# Patient Record
Sex: Female | Born: 1965 | Race: White | Hispanic: No | Marital: Married | Smoking: Never smoker
Health system: Southern US, Community
[De-identification: ages and names within clinical notes are randomized; demographics above are authoritative.]

## PROBLEM LIST (undated history)

## (undated) DIAGNOSIS — R011 Cardiac murmur, unspecified: Secondary | ICD-10-CM

## (undated) DIAGNOSIS — B977 Papillomavirus as the cause of diseases classified elsewhere: Secondary | ICD-10-CM

## (undated) DIAGNOSIS — IMO0002 Reserved for concepts with insufficient information to code with codable children: Secondary | ICD-10-CM

## (undated) DIAGNOSIS — N870 Mild cervical dysplasia: Secondary | ICD-10-CM

## (undated) DIAGNOSIS — C4491 Basal cell carcinoma of skin, unspecified: Secondary | ICD-10-CM

## (undated) HISTORY — DX: Basal cell carcinoma of skin, unspecified: C44.91

## (undated) HISTORY — DX: Papillomavirus as the cause of diseases classified elsewhere: B97.7

## (undated) HISTORY — DX: Mild cervical dysplasia: N87.0

## (undated) HISTORY — DX: Cardiac murmur, unspecified: R01.1

## (undated) HISTORY — DX: Reserved for concepts with insufficient information to code with codable children: IMO0002

## (undated) HISTORY — PX: AUGMENTATION MAMMAPLASTY: SUR837

---

## 1989-11-11 HISTORY — PX: KNEE SURGERY: SHX244

## 1999-10-26 ENCOUNTER — Other Ambulatory Visit: Admission: RE | Admit: 1999-10-26 | Discharge: 1999-10-26 | Payer: Self-pay | Admitting: Obstetrics and Gynecology

## 1999-11-12 HISTORY — PX: MANDIBLE SURGERY: SHX707

## 2000-05-30 ENCOUNTER — Encounter: Admission: RE | Admit: 2000-05-30 | Discharge: 2000-05-30 | Payer: Self-pay | Admitting: Oral Surgery

## 2000-05-30 ENCOUNTER — Encounter: Payer: Self-pay | Admitting: Oral Surgery

## 2000-06-02 ENCOUNTER — Ambulatory Visit (HOSPITAL_BASED_OUTPATIENT_CLINIC_OR_DEPARTMENT_OTHER): Admission: RE | Admit: 2000-06-02 | Discharge: 2000-06-03 | Payer: Self-pay

## 2000-12-16 ENCOUNTER — Other Ambulatory Visit: Admission: RE | Admit: 2000-12-16 | Discharge: 2000-12-16 | Payer: Self-pay | Admitting: Gynecology

## 2001-10-03 ENCOUNTER — Inpatient Hospital Stay (HOSPITAL_COMMUNITY): Admission: AD | Admit: 2001-10-03 | Discharge: 2001-10-05 | Payer: Self-pay | Admitting: *Deleted

## 2001-11-09 ENCOUNTER — Other Ambulatory Visit: Admission: RE | Admit: 2001-11-09 | Discharge: 2001-11-09 | Payer: Self-pay | Admitting: Gynecology

## 2001-11-12 ENCOUNTER — Encounter: Payer: Self-pay | Admitting: *Deleted

## 2001-11-12 ENCOUNTER — Encounter: Admission: RE | Admit: 2001-11-12 | Discharge: 2001-11-12 | Payer: Self-pay | Admitting: Gynecology

## 2003-03-04 ENCOUNTER — Other Ambulatory Visit: Admission: RE | Admit: 2003-03-04 | Discharge: 2003-03-04 | Payer: Self-pay | Admitting: Gynecology

## 2003-04-14 ENCOUNTER — Ambulatory Visit (HOSPITAL_COMMUNITY): Admission: RE | Admit: 2003-04-14 | Discharge: 2003-04-14 | Payer: Self-pay | Admitting: Family Medicine

## 2003-04-14 ENCOUNTER — Encounter: Payer: Self-pay | Admitting: Family Medicine

## 2003-12-05 ENCOUNTER — Inpatient Hospital Stay (HOSPITAL_COMMUNITY): Admission: AD | Admit: 2003-12-05 | Discharge: 2003-12-08 | Payer: Self-pay | Admitting: Gynecology

## 2004-01-30 DIAGNOSIS — D229 Melanocytic nevi, unspecified: Secondary | ICD-10-CM

## 2004-01-30 DIAGNOSIS — C4499 Other specified malignant neoplasm of skin, unspecified: Secondary | ICD-10-CM

## 2004-01-30 HISTORY — DX: Other specified malignant neoplasm of skin, unspecified: C44.99

## 2004-01-30 HISTORY — DX: Melanocytic nevi, unspecified: D22.9

## 2004-02-01 ENCOUNTER — Other Ambulatory Visit: Admission: RE | Admit: 2004-02-01 | Discharge: 2004-02-01 | Payer: Self-pay | Admitting: Gynecology

## 2005-02-25 ENCOUNTER — Other Ambulatory Visit: Admission: RE | Admit: 2005-02-25 | Discharge: 2005-02-25 | Payer: Self-pay | Admitting: Gynecology

## 2005-09-05 ENCOUNTER — Other Ambulatory Visit: Admission: RE | Admit: 2005-09-05 | Discharge: 2005-09-05 | Payer: Self-pay | Admitting: Gynecology

## 2005-09-11 DIAGNOSIS — IMO0002 Reserved for concepts with insufficient information to code with codable children: Secondary | ICD-10-CM

## 2005-09-11 HISTORY — DX: Reserved for concepts with insufficient information to code with codable children: IMO0002

## 2006-06-10 ENCOUNTER — Ambulatory Visit (HOSPITAL_COMMUNITY): Admission: RE | Admit: 2006-06-10 | Discharge: 2006-06-10 | Payer: Self-pay | Admitting: Gynecology

## 2006-06-11 DIAGNOSIS — N870 Mild cervical dysplasia: Secondary | ICD-10-CM

## 2006-06-11 HISTORY — DX: Mild cervical dysplasia: N87.0

## 2006-06-12 ENCOUNTER — Other Ambulatory Visit: Admission: RE | Admit: 2006-06-12 | Discharge: 2006-06-12 | Payer: Self-pay | Admitting: Gynecology

## 2006-12-25 ENCOUNTER — Other Ambulatory Visit: Admission: RE | Admit: 2006-12-25 | Discharge: 2006-12-25 | Payer: Self-pay | Admitting: Gynecology

## 2007-06-18 ENCOUNTER — Other Ambulatory Visit: Admission: RE | Admit: 2007-06-18 | Discharge: 2007-06-18 | Payer: Self-pay | Admitting: Gynecology

## 2008-11-11 DIAGNOSIS — B977 Papillomavirus as the cause of diseases classified elsewhere: Secondary | ICD-10-CM

## 2008-11-11 HISTORY — DX: Papillomavirus as the cause of diseases classified elsewhere: B97.7

## 2009-05-10 ENCOUNTER — Ambulatory Visit: Payer: Self-pay | Admitting: Women's Health

## 2009-05-10 ENCOUNTER — Other Ambulatory Visit: Admission: RE | Admit: 2009-05-10 | Discharge: 2009-05-10 | Payer: Self-pay | Admitting: Gynecology

## 2009-05-10 ENCOUNTER — Encounter: Payer: Self-pay | Admitting: Women's Health

## 2009-07-04 ENCOUNTER — Ambulatory Visit: Payer: Self-pay | Admitting: Gynecology

## 2009-07-12 ENCOUNTER — Ambulatory Visit: Payer: Self-pay | Admitting: Gynecology

## 2010-07-06 ENCOUNTER — Other Ambulatory Visit: Admission: RE | Admit: 2010-07-06 | Discharge: 2010-07-06 | Payer: Self-pay | Admitting: Gynecology

## 2010-07-06 ENCOUNTER — Ambulatory Visit: Payer: Self-pay | Admitting: Women's Health

## 2010-10-23 ENCOUNTER — Encounter
Admission: RE | Admit: 2010-10-23 | Discharge: 2010-10-23 | Payer: Self-pay | Source: Home / Self Care | Attending: Chiropractic Medicine | Admitting: Chiropractic Medicine

## 2010-12-25 ENCOUNTER — Other Ambulatory Visit: Payer: Self-pay | Admitting: Endocrinology

## 2010-12-25 DIAGNOSIS — E049 Nontoxic goiter, unspecified: Secondary | ICD-10-CM

## 2011-03-29 NOTE — Discharge Summary (Signed)
[facility]  Patient:    BALEIGH, RENNAKER Visit Number: [id number] MRN: [id number]          Service Type: OBS Location: 910A [id number] [id number] Attending Physician:  Wetzel Bjornstad Dictated by:   Antony Contras, [facility] Admit Date:  10/03/2001 Discharge Date: 10/05/2001                             Discharge Summary  DISCHARGE DIAGNOSES:          1. Intrauterine pregnancy at term.                               2. Occiput posterior position.                               3. Chorioamnionitis.  PROCEDURES:                   Outlet forceps-assisted vaginal delivery, with delivery of viable infant.  HISTORY OF PRESENT ILLNESS:   The patient is a 45 year old primigravida with an EDC of October 06, 2001, by ultrasound.  Prenatal course was uncomplicated.  PRENATAL LABORATORY DATA:     Blood type O negative.  Husband is also O negative.  Antibody screen negative.  RPR, HBsAg, HIV nonreactive.  The patient declined MSAFP.  HOSPITAL COURSE:              The patient was admitted on October 03, 2001, with spontaneous onset of labor at 39 weeks.  Cervix rapidly changed from 1.5-4.0 cm.  She did progress to complete dilatation.  Did develop a fever of 102 with fetal tachycardia.  Delivery was accomplished with outlet forceps. She delivered an Apgars of 7 and 53 female infant weighing 7 pounds 6 ounces over an intact perineum with repair of a second-degree and focal laceration.  POSTPARTUM COURSE:            She remained afebrile, with no difficulty voiding.  Was able to be discharged on her second postpartum day in satisfactory condition.  CBC:  Hematocrit 31.4, hemoglobin 11.1, WBC 26.9, platelets 198.  FOLLOW-UP:                    In six weeks.  MEDICATIONS:                  Continue with prenatal vitamins and iron. Motrin or Tylox for pain. Dictated by:   Antony Contras, [facility] Attending Physician:  Wetzel Bjornstad DD:  11/13/01 TD:  11/14/01 Job:  [id number]

## 2011-03-29 NOTE — Op Note (Signed)
[place]  Patient:    Andrea Lamb, Andrea Lamb                      MRN: [id number] Proc. Date: 06/02/00 Attending:  Vania Rea. Warren Danes, D.D.S.                           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  SURGERY PERFORMED:  Jerry Caras I Maxillary osteotomy with mid palatal segmentalization, activation of custom appliance.  SURGEON:  Vania Rea. Warren Danes, D.D.S.  FIRST ASSISTANT:  Gaynell Face.  ANESTHESIA:  General via nasal endotracheal intubation.  ESTIMATED BLOOD LOSS:  Less than 100 cc.  FLUID REPLACEMENT:  Approximately 1200 cc Crystalloid solutions.  COMPLICATIONS:  None apparent.  INDICATIONS FOR PROCEDURE:  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  The patient has a chronic history of total inability to bite into normal type foods due to the inverse relationship of the maxillary and mandibular dentition and as a result, the patient has developed significant and chronic temporomandibular joint dysfunction, including anteriorly displaced temporomandibular menisci and associated pain.  All conservative measures have been attempted, but due to the severe transverse malformation of the maxilla, the only means of surgery likely to correct this was surgical intervention.  The surgery is being performed for a functional reason only.  DETAILS OF PROCEDURE:  On June 02, 2000, [name] was taken to [facility], where she was placed on the operating room table in a supine position.  Following successful nasal endotracheal intubation of general anesthesia, the  patients face, neck and oral cavity were prepped and draped in the usual sterile operating room fashion.  The hypopharynx was suctioned free of fluids and secretions, and a moistened 2 inch vaginal pack was placed as a throat pack.  Attention was then directed intraorally, where approximately 16 cc of 0.5% Xylocaine containing 1:200,000 epinephrine were infiltrated in the maxillary buccal vestibular tissues, the posterior, superior alveolar neurovascular regions, and the corresponding palatal soft tissues.  A full-thickness mucoperiosteal incision was made in the depth of the maxillary vestibule beginning over tooth #11 and brought horizontally through the depth of the vestibule above tooth #6.  The incision was scribed to aid in closure at the end of the procedure.  A #9 Molt periosteal elevator was then used to reflect a full-thickness mucoperiosteal flap superiorly approximately 1.5 cm exposing the anterior nasal spine and the right and left piriform rim.  Anterior nasal spine was reduced using rongeur and cutting forceps.  The nasal mucosa tissues were then reflected off of the nasal floor and the right and left lateral nasal walls using a straight Therapist, nutritional.  The periosteum was then reflected off of the right and left maxilla posteriorly from the piriform rim to the maxillary buttresses.  From this point posteriorly, a curved Freer elevator was used to reflect the periosteum off the lateral aspects of the maxilla.  The dissection was carried posteriorly to the pterygomaxillary junctions, which were identified using the curved Freer elevators.  Approximately 6 mm above the apices of the maxillary dentition, the proposed osteotomy site was marked using an Stryker rotary osteotome and a 107 fissure bur.  Obwegeser retractors were used to protect the lateral soft tissues and a straight Therapist, nutritional was used to protect the mucosal tissues.  With copious irrigation and the Stryker  rotary osteotome, a Jerry Caras I maxillary osteotomy was completed bilaterally from the piriform rim posteriorly to the maxillary buttress.  The osteotomy was then completed posteriorly from the maxillary buttress to the pterygomaxillary junction using the Stryker reciprocating saw with the small straight blade.  The right and left lateral nasal walls were then sectioned at this same level using a spatula osteotome with gentle tapping pressures from a fiberglass tipped mallet.  The pterygomaxillary junction was then separated bilaterally using a pterygomaxillary osteotome with the osteotome being directed medially, anteriorly and inferiorly.  The pterygoid plate was separated from the posterior aspect of the maxilla with the fiberglass tipped mallet and the osteotome with digital pressure being used to protect the palatal soft tissues.  ______ complete mobility of the maxilla was obtained.  A nasal septal osteotome was then used to release the nasal septum from the nasal floor with the osteotome being directed posteriorly and inferiorly.  Digital pressure was used to protect the posterior palatal soft tissues.  The mid palatal suture was then identified and the maxilla was then segmentalized in the following manner.  Starting with the spatula osteotome, the palatal suture was slightly separated.  A small straight osteotome was then placed into this area and with gentle tapping pressure from the fiberglass tipped mallet, the area was widened.  Digital pressure was used to protect the palatal soft tissues.  Complete mobility of the right and left maxilla was obtained using the fiberglass handled osteotome, which was twisted ensuring complete mobility of the segments.  A previously constructed custom appliance was then activated to secure the maxilla in its new position.  The mucoperiosteal margins were then approximated following thorough irrigation with sterile saline irrigating solutions  and suctioning.  The mucoperiosteal margins were sutured using 4-0 chromic suture material on a FS-2 needle.  The oral cavity was then thoroughly irrigated and suctioned.  The throat pack  was removed from the hypopharynx and the hypopharynx suctioned free of fluids and secretions.  Ms. Rama was allowed to awaken from the anesthesia and taken to the recovery room, where she tolerated the procedure well and without apparent complication. DD:  06/02/00 TD:  06/03/00 Job: [id number]

## 2011-03-29 NOTE — H&P (Signed)
NAME:  Andrea Lamb, Andrea Lamb                       ACCOUNT NO.:  [id number]   MEDICAL RECORD NO.:  [id number]                   PATIENT TYPE:  MAT   LOCATION:  MATC                                 [facility]ILITY:  [facility]   PHYSICIAN:  Juan H. Lily Peer, M.D.             DATE OF BIRTH:  09-03-1966   DATE OF ADMISSION:  DATE OF DISCHARGE:                                HISTORY & PHYSICAL   SCHEDULED DATE OF ADMISSION:  December 05, 2003   REASON FOR ADMISSION:  1. Term intrauterine pregnancy at 15 weeks estimated gestational age.  2. Oligohydramnios.   HISTORY:  The patient is a 45 year old gravida 2 para 1 at 28 weeks  estimated gestational age who is being induced this evening secondary to  oligohydramnios.  She had an ultrasound today at [facility] with  an AFI of 7.2.  The fetus is in the vertex presentation, heart rate 124.  The patient's prenatal course essentially had been unremarkable with the  exception of advanced maternal age.  She had declined genetic amniocentesis  and maternal serum alpha-fetoprotein.  Both she and her husband were Rh  negative so she did not receive RhoGAM.  The patient has had appropriate  weight up to 31 pounds this pregnancy has passed her diabetes screen.   PAST MEDICAL HISTORY:  She denies any allergies.  She has had a prior normal  spontaneous vaginal delivery in 2002 at [redacted] weeks gestation at 7 pounds 6  ounces.  See Hollister form.   REVIEW OF SYSTEMS:  See Hollister form.   PHYSICAL EXAMINATION:  VITAL SIGNS:  Her blood pressure was 108/66.  Urine  was trace for protein, negative for glucose.  Her weight was 173 pounds.  HEENT:  Unremarkable.  NECK:  Supple, trachea midline.  No carotid bruits, no thyromegaly.  LUNGS:  Clear to auscultation without rhonchi or wheezes.  HEART:  Regular rate and rhythm, no murmurs or gallops.  BREAST:  Exam was not done.  ABDOMEN:  Gravid uterus, vertex presentation by [employer].  Fundal  height 39  cm.  PELVIC:  Cervix was fingertip to 1 cm, 50%, ballotable.  EXTREMITIES:  DTRs 1+, negative clonus, trace edema.   PRENATAL LABORATORY DATA:  Blood type O negative, negative antibody screen.  VDRL was nonreactive.  Rubella immune.  Hepatitis B surface antigen and HIV  were negative.  Diabetes screen was normal.  Group B strep culture was  negative.  Alpha-fetoprotein, cystic fibrosis, and genetic amniocentesis  were all declined.   ASSESSMENT:  A 45 year old gravida 2 para 1 at [redacted] weeks gestation with  oligohydramnios, amniotic fetal index of 7.2 at 5% for [redacted] weeks gestation.  Cervix fingertip to 1 cm, 50%, -3 to ballotable.  The patient will be  admitted this evening for induction.  She did have an NST in the office this  afternoon with a reactive tracing noted.  She will  have the Cervidil overnight  and start in the morning with Pitocin for  delivery.  The risks, benefits, and pros and cons were discussed.  All  questions were answered and will follow accordingly.   PLAN:  As per assessment above.                                               Juan H. Lily Peer, M.D.    JHF/MEDQ  D:  12/05/2003  T:  12/05/2003  Job:  [id number]

## 2011-03-29 NOTE — Discharge Summary (Signed)
NAME:  Andrea Lamb, Andrea Lamb                       ACCOUNT NO.:  [id number]   MEDICAL RECORD NO.:  [id number]                   PATIENT TYPE:  INP   LOCATION:  9119                                 [facility]ACILITY:  [facility]   PHYSICIAN:  Timothy P. Fontaine, M.D.           DATE OF BIRTH:  11-Dec-1965   DATE OF ADMISSION:  12/05/2003  DATE OF DISCHARGE:  12/08/2003                                 DISCHARGE SUMMARY   DISCHARGE DIAGNOSES:  1. Intrauterine pregnancy at 40 weeks, delivered.  2. Oligohydramnios.  3. Status post normal spontaneous vaginal delivery.   HISTORY:  A 36-years-of-age female gravida 2 para 1 with an EDC of December 05, 2003.  Prenatal course had been complicated by advanced maternal age.  The patient declined amniocentesis.  The patient and husband are both Rh  negative.  She was found to have oligohydramnios with an AFI of 7.2.  Secondary to same the patient was admitted for induction.   HOSPITAL COURSE:  On December 05, 2003 the patient was admitted at 40 weeks.  Cervidil was placed and on the a.m. of December 06, 2003 Pitocin was begun.  Artificial rupture of membranes performed showed clear fluid.  The patient  on December 06, 2003 underwent a spontaneous vaginal delivery of a female,  Apgars of 8 and 9, weight of 7 pounds 5 ounces.  There was a third degree  vaginal/perineal tear.  There were no complications.  Postpartum the patient  remained afebrile, voiding, in stable condition, and she was discharged to  home in satisfactory condition on December 08, 2003.   ACCESSORY CLINICAL FINDINGS/LABORATORY DATA:  The patient is O negative  (infant Rh negative).  Rubella immune.  On December 07, 2003 hemoglobin was  11.9.   DISPOSITION:  1. The patient is discharged to home.  2. Return to the office in 6 weeks.  3. Given a prescription of Darvocet p.r.n. pain #15.     Susa Loffler, P.A.                    Timothy P. Audie Box, M.D.    Ardath Sax  D:  01/16/2004  T:   01/16/2004  Job:  [id number]

## 2011-06-17 ENCOUNTER — Ambulatory Visit
Admission: RE | Admit: 2011-06-17 | Discharge: 2011-06-17 | Disposition: A | Discharge status: Home / Self Care | Payer: BC Managed Care – PPO | Source: Ambulatory Visit | Attending: Endocrinology | Admitting: Endocrinology

## 2011-06-17 DIAGNOSIS — E049 Nontoxic goiter, unspecified: Secondary | ICD-10-CM

## 2011-07-08 ENCOUNTER — Other Ambulatory Visit: Payer: Self-pay | Admitting: Endocrinology

## 2011-07-08 DIAGNOSIS — E049 Nontoxic goiter, unspecified: Secondary | ICD-10-CM

## 2011-07-17 ENCOUNTER — Other Ambulatory Visit: Payer: Self-pay | Admitting: Endocrinology

## 2011-07-17 DIAGNOSIS — E041 Nontoxic single thyroid nodule: Secondary | ICD-10-CM

## 2011-07-18 DIAGNOSIS — B977 Papillomavirus as the cause of diseases classified elsewhere: Secondary | ICD-10-CM | POA: Insufficient documentation

## 2011-07-18 DIAGNOSIS — R011 Cardiac murmur, unspecified: Secondary | ICD-10-CM | POA: Insufficient documentation

## 2011-07-18 DIAGNOSIS — C4491 Basal cell carcinoma of skin, unspecified: Secondary | ICD-10-CM | POA: Insufficient documentation

## 2011-07-23 ENCOUNTER — Ambulatory Visit
Admission: RE | Admit: 2011-07-23 | Discharge: 2011-07-23 | Disposition: A | Discharge status: Home / Self Care | Payer: BC Managed Care – PPO | Source: Ambulatory Visit | Attending: Endocrinology | Admitting: Endocrinology

## 2011-07-23 ENCOUNTER — Other Ambulatory Visit (HOSPITAL_COMMUNITY)
Admission: RE | Admit: 2011-07-23 | Discharge: 2011-07-23 | Disposition: A | Discharge status: Home / Self Care | Payer: BC Managed Care – PPO | Source: Ambulatory Visit | Attending: Interventional Radiology | Admitting: Interventional Radiology

## 2011-07-23 DIAGNOSIS — E049 Nontoxic goiter, unspecified: Secondary | ICD-10-CM | POA: Insufficient documentation

## 2011-07-23 DIAGNOSIS — E041 Nontoxic single thyroid nodule: Secondary | ICD-10-CM

## 2011-07-26 ENCOUNTER — Encounter: Payer: BC Managed Care – PPO | Admitting: Women's Health

## 2011-08-13 ENCOUNTER — Ambulatory Visit (INDEPENDENT_AMBULATORY_CARE_PROVIDER_SITE_OTHER): Payer: BC Managed Care – PPO | Admitting: Women's Health

## 2011-08-13 ENCOUNTER — Other Ambulatory Visit (HOSPITAL_COMMUNITY)
Admission: RE | Admit: 2011-08-13 | Discharge: 2011-08-13 | Disposition: A | Discharge status: Home / Self Care | Payer: BC Managed Care – PPO | Source: Ambulatory Visit | Attending: Gynecology | Admitting: Gynecology

## 2011-08-13 ENCOUNTER — Encounter: Payer: Self-pay | Admitting: Women's Health

## 2011-08-13 VITALS — BP 110/70 | Ht 64.25 in | Wt 140.0 lb

## 2011-08-13 DIAGNOSIS — Z01419 Encounter for gynecological examination (general) (routine) without abnormal findings: Secondary | ICD-10-CM

## 2011-08-13 NOTE — Progress Notes (Signed)
Andrea Lamb 1966-08-06 [id number]   History:    The patient presents for annual exam.  Works part-time no complaints   Past medical history, past surgical history, family history and social history were all reviewed and documented in the EPIC chart.   ROS:  A  ROS was performed and pertinent positives and negatives are included in the history.  Exam:  Filed Vitals:   08/13/11 1048  BP: 110/70    General appearance:  Normal Head/Neck:  Normal, without cervical or supraclavicular adenopathy. Thyroid:  Symmetrical, normal in size, without palpable masses or nodularity. Respiratory  Effort:  Normal  Auscultation:  Clear without wheezing or rhonchi Cardiovascular  Auscultation:  Regular rate, without rubs, murmurs or gallops  Edema/varicosities:  Not grossly evident Abdominal  Soft,nontender, without masses, guarding or rebound.  Liver/spleen:  No organomegaly noted  Hernia:  None appreciated  Skin  Inspection:  Grossly normal  Palpation:  Grossly normal Neurologic/psychiatric  Orientation:  Normal with appropriate conversation.  Mood/affect:  Normal  Genitourinary    Breasts: Examined lying and sitting/augmented.     Right: Without masses, retractions, discharge or axillary adenopathy.     Left: Without masses, retractions, discharge or axillary adenopathy.   Inguinal/mons:  Normal without inguinal adenopathy  External genitalia:  Normal  BUS/Urethra/Skene's glands:  Normal  Bladder:  Normal  Vagina:  Normal  Cervix:  Normal  Uterus: retroverted, normal in size, shape and contour.  Midline and mobile  Adnexa/parametria:     Rt: Without masses or tenderness.   Lt: Without masses or tenderness.  Anus and perineum: Normal  Digital rectal exam: Normal sphincter tone without palpated masses or tenderness  Assessment/Plan:  45 y.o. MWF G2P2 for annual exam. Monthly 5-6 day cycle/vasectomy. Diagnosed with a thyroid nodule this past year, normal thyroid function,  negative biopsy. Labs at primary care.  Normal GYN exam  Plan: SBEs, schedule mammogram she had a normal baseline did review importance of an annual screening. Continue exercise and healthy lifestyle calcium rich diet encouraged. Pap only.    Harrington Challenger [facility], 11:26 AM 08/13/2011

## 2012-04-10 ENCOUNTER — Other Ambulatory Visit: Payer: Self-pay | Admitting: Internal Medicine

## 2012-04-10 DIAGNOSIS — E042 Nontoxic multinodular goiter: Secondary | ICD-10-CM

## 2012-04-10 DIAGNOSIS — E049 Nontoxic goiter, unspecified: Secondary | ICD-10-CM

## 2012-04-22 ENCOUNTER — Ambulatory Visit
Admission: RE | Admit: 2012-04-22 | Discharge: 2012-04-22 | Disposition: A | Discharge status: Home / Self Care | Payer: BC Managed Care – PPO | Source: Ambulatory Visit | Attending: Internal Medicine | Admitting: Internal Medicine

## 2012-04-22 DIAGNOSIS — E049 Nontoxic goiter, unspecified: Secondary | ICD-10-CM

## 2012-04-22 DIAGNOSIS — E042 Nontoxic multinodular goiter: Secondary | ICD-10-CM

## 2012-06-04 IMAGING — US US THYROID BIOPSY
1 series · 13 of 18 positions shown · non-contrast
Comparison: none

INDICATION: Thyroid nodule

[Series 1: us thyroid biopsy · 0.06mm/px · 18 acquisitions, 13 frames shown]
[im 1/18]
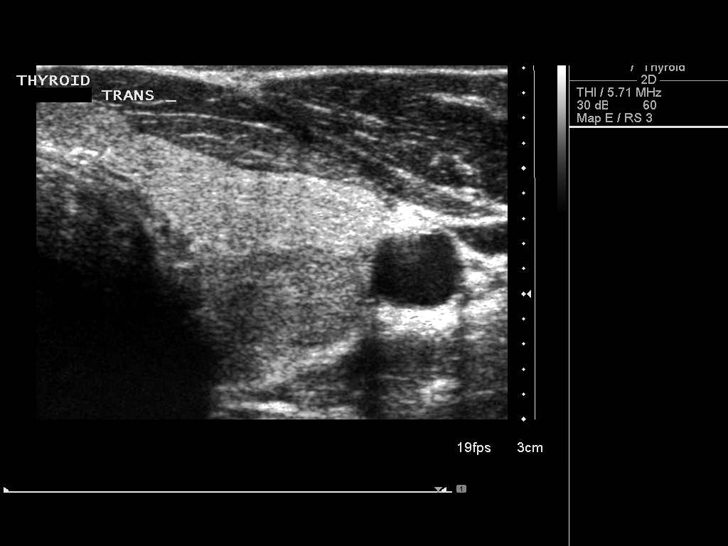
[im 3/18]
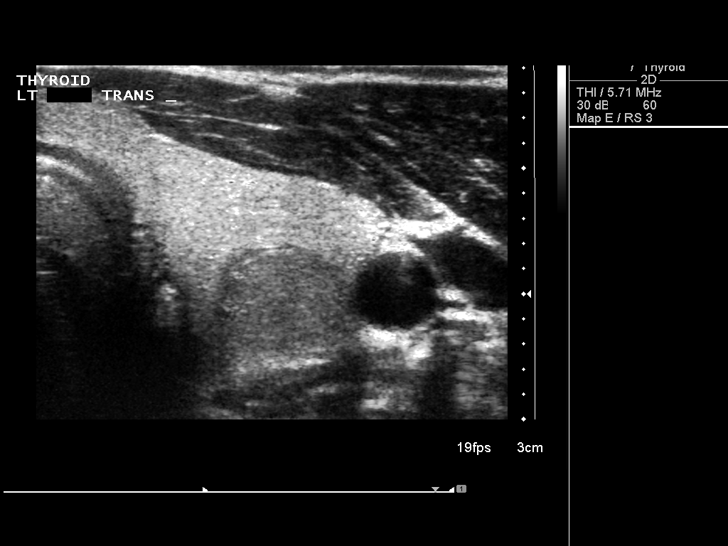
[im 4/18]
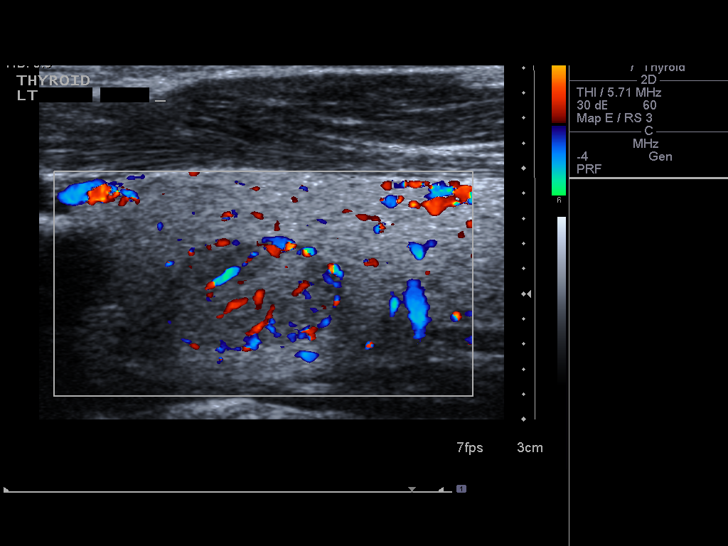
[im 5/18]
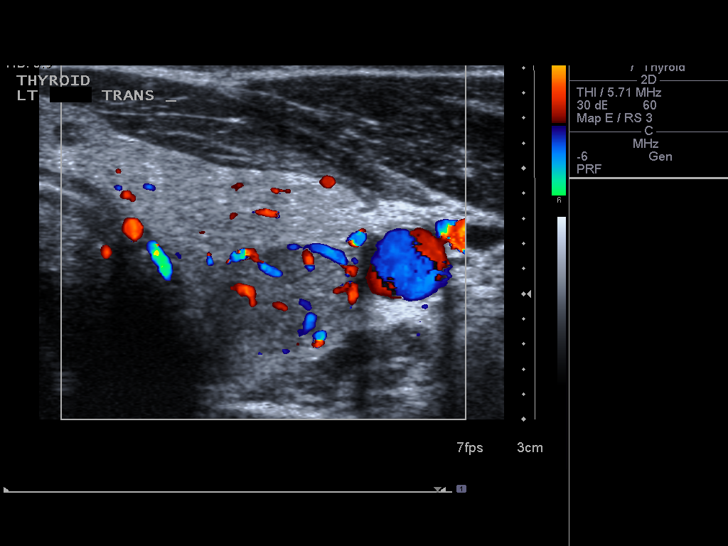
[im 7/18]
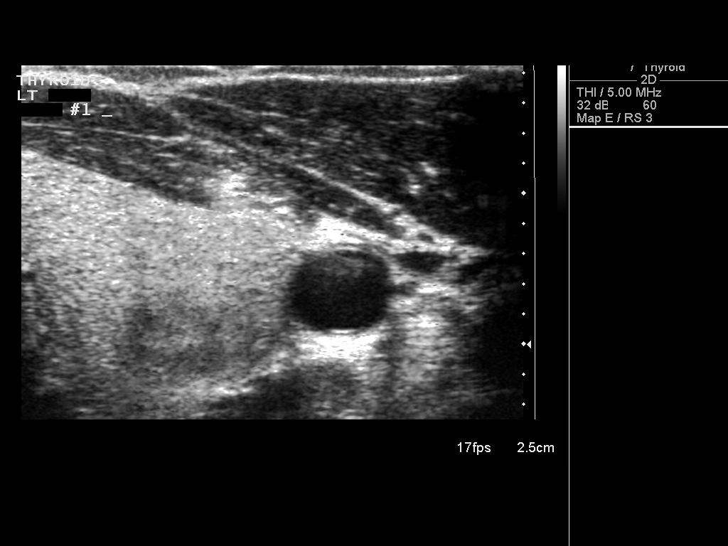
[im 8/18]
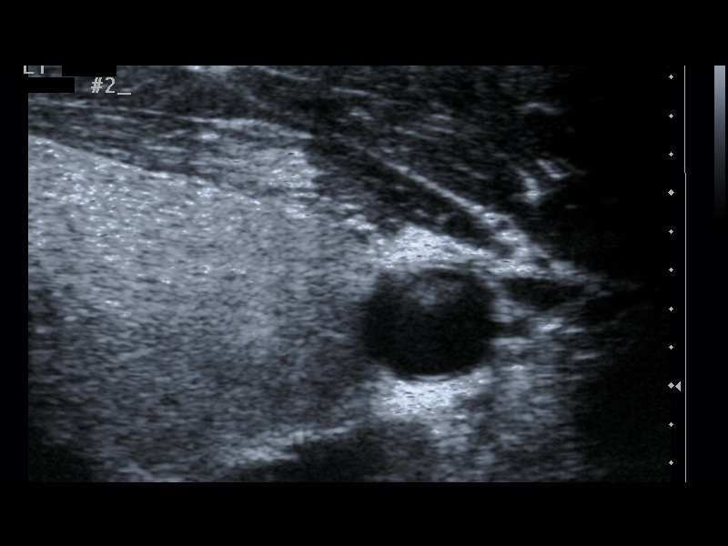
[im 10/18]
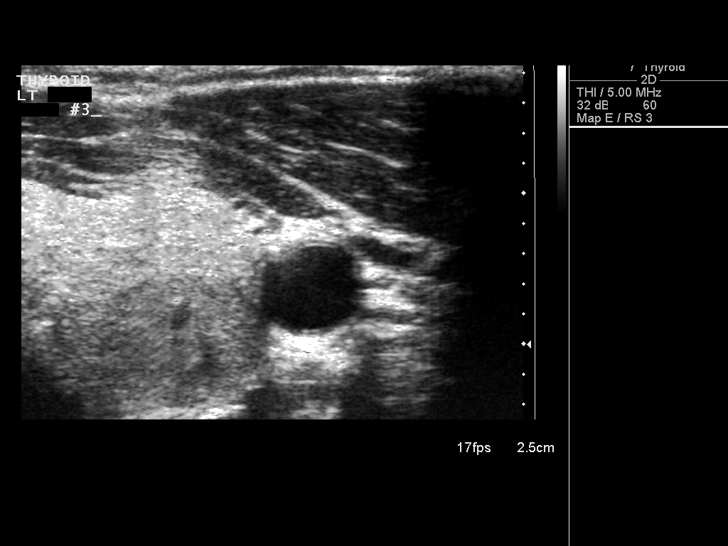
[im 11/18]
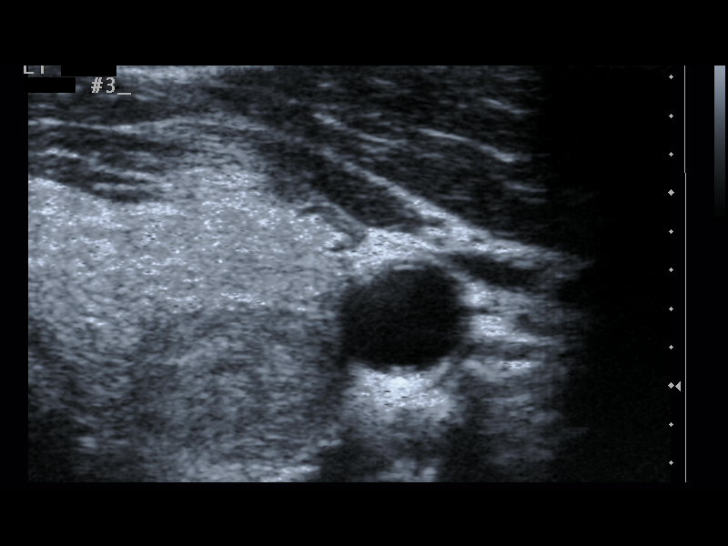
[im 12/18]
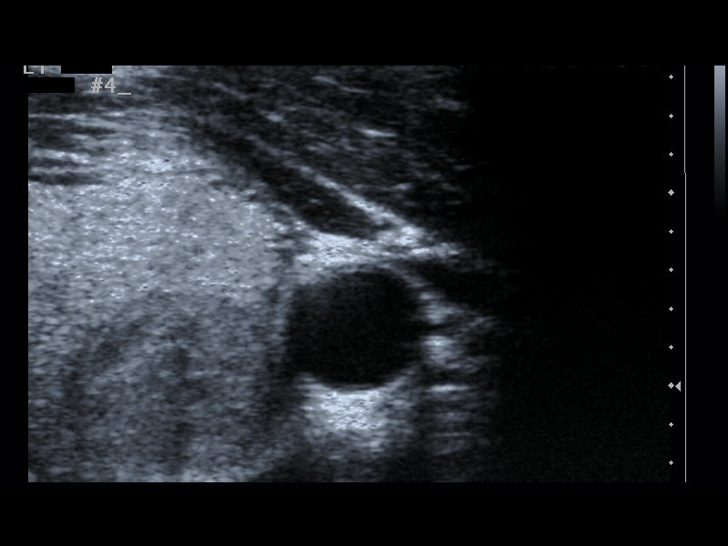
[im 14/18]
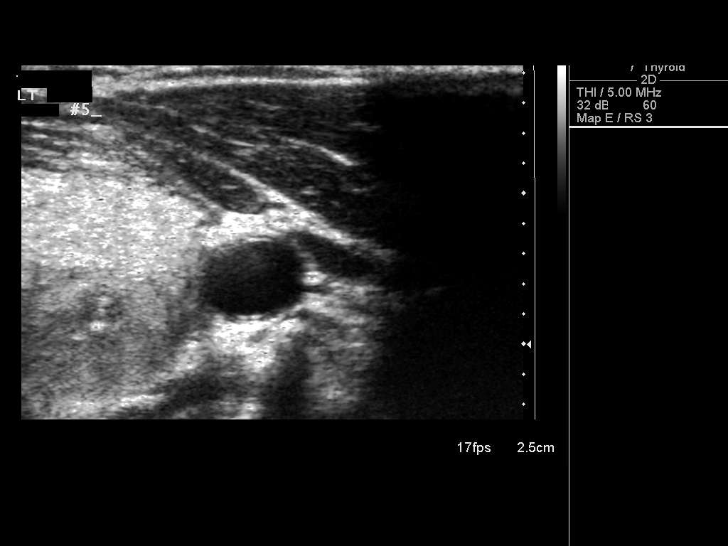
[im 15/18]
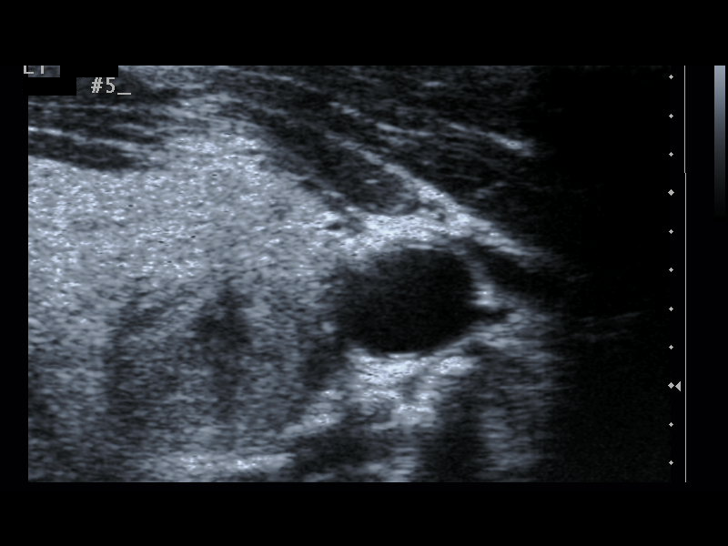
[im 16/18]
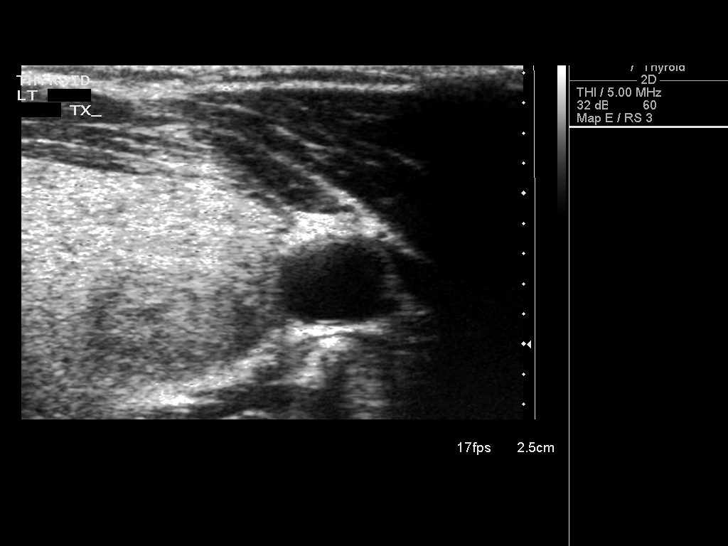
[im 18/18]
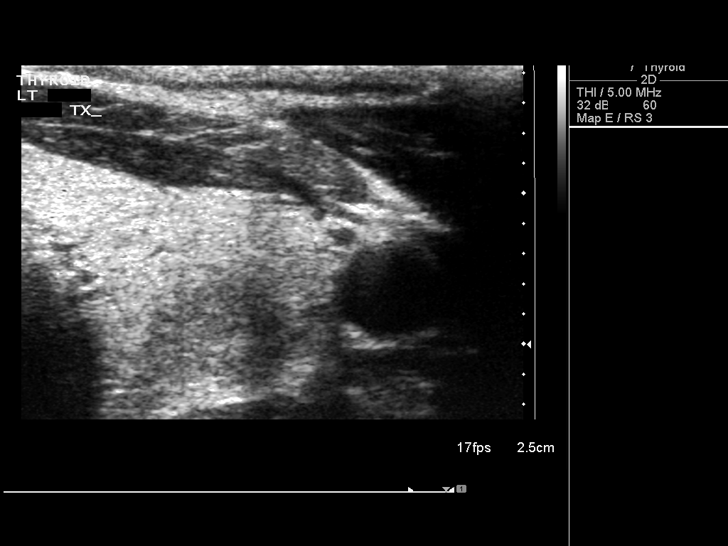

[13 of 18 positions shown; findings below may reference images not displayed]

ULTRASOUND GUIDED THYROID FINE NEEDLE ASPIRATION

Comparisons: Thyroid Ultrasound - thyroid ultrasound - 06/17/2011;
cervical spine MRI - 10/23/2010

Intravenous Medications: None

Complications: None immediate

Technique / Findings:

Informed written consent was obtained from the patient after a
discussion of the risks, benefits and alternatives to treatment.
Questions regarding the procedure were encouraged and answered.  A
timeout was performed prior to the initiation of the procedure.

Pre-procedural ultrasound scanning demonstrated heterogeneous,
solid nodule within the mid aspect of the left lobe of the
thyroid.  The procedure was planned.  The neck was prepped and
draped in the usual sterile fashion, and a sterile drape was
applied covering the operative field.  A timeout was performed
prior to the initiation of the procedure.  Local anesthesia was
provided with 1% lidocaine.

Under direct ultrasound guidance, 5FNA biopsies were performed of
the solid nodule within the mid aspect of the left lobe of the
thyroid with a 25 gauge needle.  The samples were prepared and
submitted to pathology.  Limited post procedural scanning was
negative for significant hematoma.  A dressing was placed.  The
patient tolerated procedure well without immediate postprocedural
complication.
IMPRESSION: Successful ultrasound guided fine needle aspiration of dominant
solid nodule within the mid aspect of the left lobe of the thyroid.

## 2012-09-11 ENCOUNTER — Ambulatory Visit (INDEPENDENT_AMBULATORY_CARE_PROVIDER_SITE_OTHER): Payer: BC Managed Care – PPO | Admitting: Women's Health

## 2012-09-11 ENCOUNTER — Encounter: Payer: Self-pay | Admitting: Women's Health

## 2012-09-11 VITALS — BP 98/64 | Ht 64.0 in | Wt 142.0 lb

## 2012-09-11 DIAGNOSIS — Z01419 Encounter for gynecological examination (general) (routine) without abnormal findings: Secondary | ICD-10-CM

## 2012-09-11 NOTE — Progress Notes (Signed)
Andrea Lamb [birth year] [id number]    History:    The patient presents for annual exam.  Monthly 5-6 day cycles/vasectomy with no complaints. History of LGSIL with positive HR HPV 2010 with normal Paps after. History of a normal screening mammogram has not had followup and declines, states Dr. Lanora Manis Vaughn/primary care does labs and ultrasounds breasts.   Past medical history, past surgical history, family history and social history were all reviewed and documented in the EPIC chart. Alissa 10, Sophia 8, both doing well. Works part time/design work.. History of functional heart murmur. History Basal skin cancer.   ROS:  A  ROS was performed and pertinent positives and negatives are included in the history.  Exam:  Filed Vitals:   09/11/12 0843  BP: 98/64    General appearance:  Normal Head/Neck:  Normal, without cervical or supraclavicular adenopathy. Thyroid:  Symmetrical, normal in size, without palpable masses or nodularity. Respiratory  Effort:  Normal  Auscultation:  Clear without wheezing or rhonchi Cardiovascular  Auscultation:  Regular rate, without rubs, murmurs or gallops  Edema/varicosities:  Not grossly evident Abdominal  Soft,nontender, without masses, guarding or rebound.  Liver/spleen:  No organomegaly noted  Hernia:  None appreciated  Skin  Inspection:  Grossly normal  Palpation:  Grossly normal Neurologic/psychiatric  Orientation:  Normal with appropriate conversation.  Mood/affect:  Normal  Genitourinary    Breasts: Examined lying and sitting/saline implants.     Right: Without masses, retractions, discharge or axillary adenopathy.     Left: Without masses, retractions, discharge or axillary adenopathy.   Inguinal/mons:  Normal without inguinal adenopathy  External genitalia:  Normal  BUS/Urethra/Skene's glands:  Normal  Bladder:  Normal  Vagina:  Normal  Cervix:  Normal  Uterus:   normal in size, shape and contour.  Midline and  mobile  Adnexa/parametria:     Rt: Without masses or tenderness.   Lt: Without masses or tenderness.  Anus and perineum: Normal  Digital rectal exam: Normal sphincter tone without palpated masses or tenderness  Assessment/Plan:  46 y.o. M. WF G2 P2 for annual exam with no complaints.  History of LGSIL/CIN-1 positive HR HPV 2010 normal Paps after Basal skin cancer history-annual dermatology exam  Plan: SBE's, encouraged annual screening mammograms. Reviewed [employer] College gynecologists and cancer society recommend annual mammogram. Continue healthy diet and exercise routine. Calcium rich diet, vitamin D 1000 daily encouraged. No labs, done at primary care. Pap normal 2012, new screening guidelines reviewed.    Harrington Challenger [facility], 3:37 PM 09/11/2012

## 2013-02-23 ENCOUNTER — Encounter: Payer: Self-pay | Admitting: Family Medicine

## 2013-02-23 ENCOUNTER — Ambulatory Visit (INDEPENDENT_AMBULATORY_CARE_PROVIDER_SITE_OTHER): Payer: BC Managed Care – PPO | Admitting: Family Medicine

## 2013-02-23 VITALS — BP 110/60 | HR 66 | Wt 141.0 lb

## 2013-02-23 DIAGNOSIS — Z9889 Other specified postprocedural states: Secondary | ICD-10-CM

## 2013-02-23 DIAGNOSIS — M25461 Effusion, right knee: Secondary | ICD-10-CM

## 2013-02-23 DIAGNOSIS — M25469 Effusion, unspecified knee: Secondary | ICD-10-CM

## 2013-02-23 NOTE — Progress Notes (Signed)
  Subjective:    Patient ID: Andrea Lamb, female    DOB: 1966-08-07, 47 y.o.   MRN: [id number]  HPI She has a six-day history of right knee soreness and swelling. She woke up with soreness in her knee. She did have a heavy workout the day before this occurred and the day of the knee soreness, she walked around all day in high heels. Later that day after running she noted swelling. No popping, locking or grinding. She continued to workout after the initial discomfort.She has been resting it and using ice and Advil.   Review of Systems     Objective:   Physical Exam Exam of the right knee does show a moderate effusion. No tenderness to palpation over the patellar tendon or patella. Medial collateral ligament is loose at 30 but tight at full extension. Anterior cruciate ligament is loose with one to one and a half centimeters of motion. McMurray's not done. Normal strength.       Assessment & Plan:  Knee effusion, right  S/P ACL repair the original anterior cruciate ligament was in 1991. Recommend conservative care with heat and Advil as well as relative rest. Reevaluate this in 2 weeks. Discussed physical therapy versus potential further intervention.

## 2013-02-23 NOTE — Patient Instructions (Signed)
Heat for 20 minutes 3 times per day. A knee sleeve as needed for comfort. 2 Aleve twice per day. If it hurts don't do it!

## 2013-03-05 IMAGING — US US SOFT TISSUE HEAD/NECK
1 series · 14 of 25 positions shown · non-contrast
Comparison: Ultrasound of the thyroid of 06/17/2011

CLINICAL DATA: Thyroid goiter

THYROID ULTRASOUND
TECHNIQUE: Ultrasound examination of the thyroid gland and adjacent
soft tissues was performed.

[Series 1: us soft tissue head/neck · 0.05mm/px · 14 of 54 slices shown]
[im 1/54]
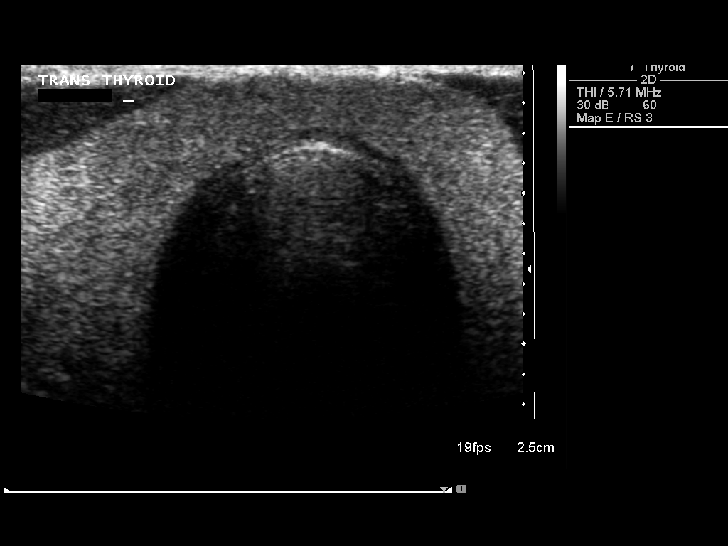
[im 5/54]
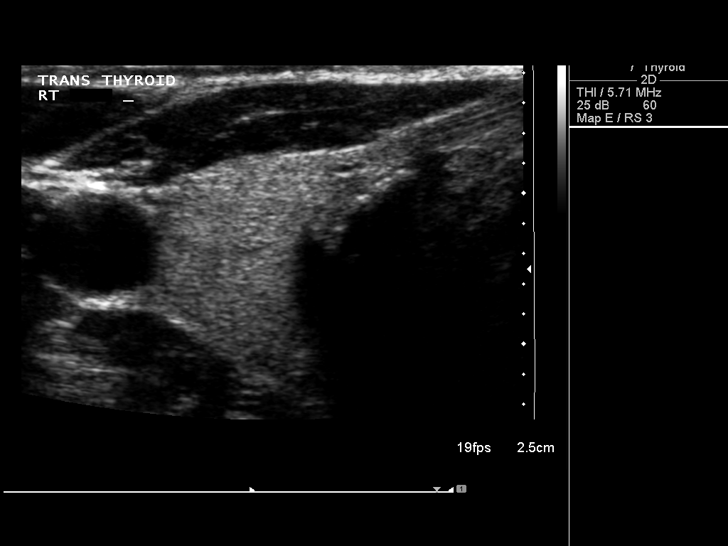
[im 9/54]
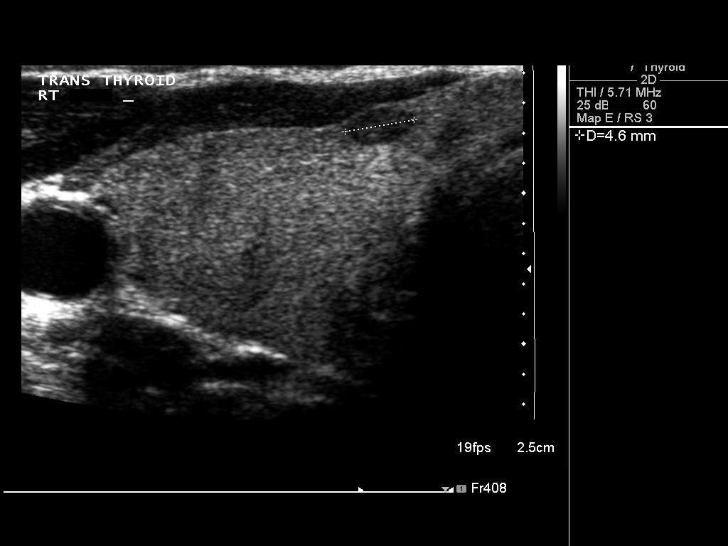
[im 14/54]
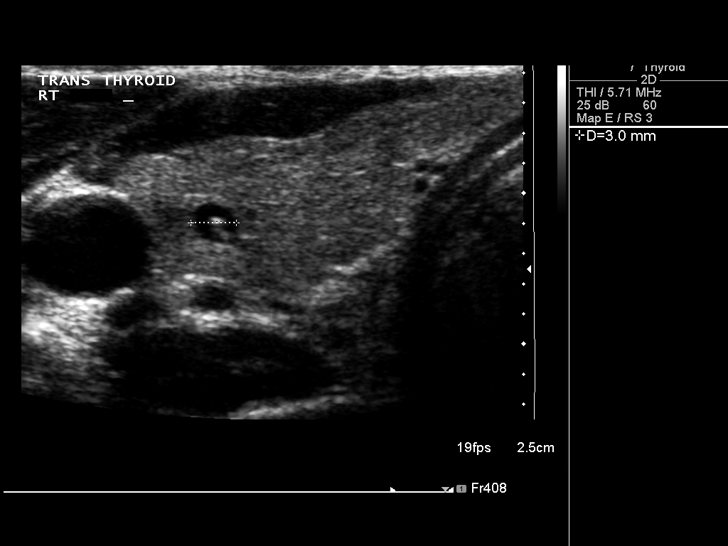
[im 18/54]
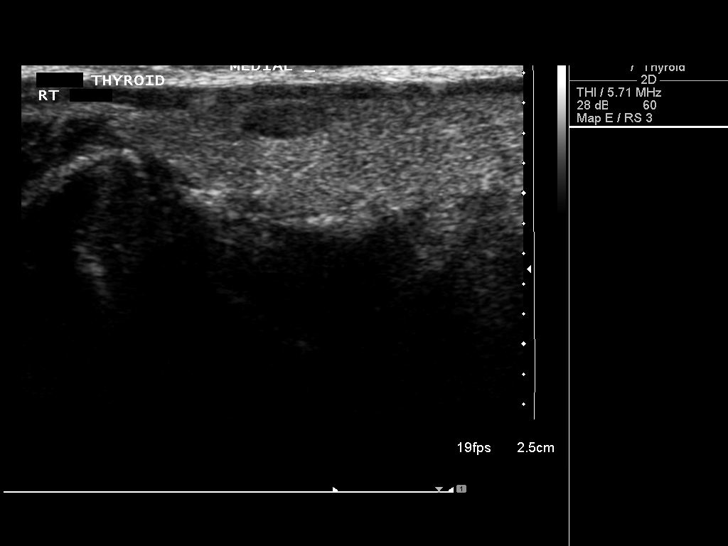
[im 20/54]
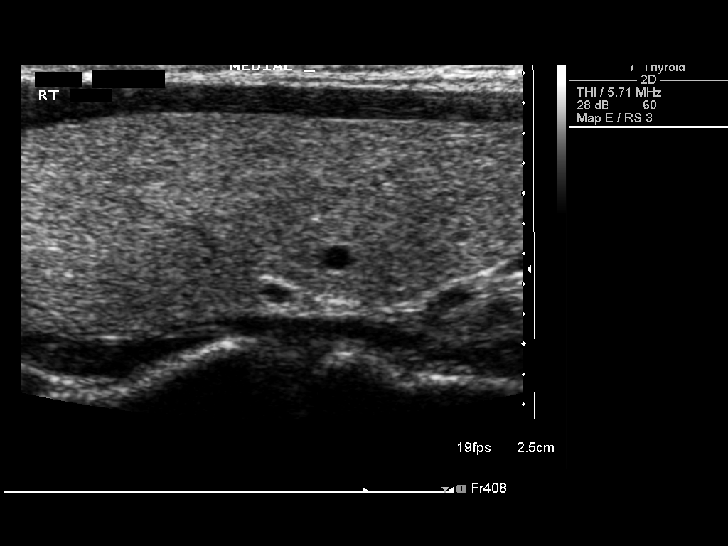
[im 25/54]
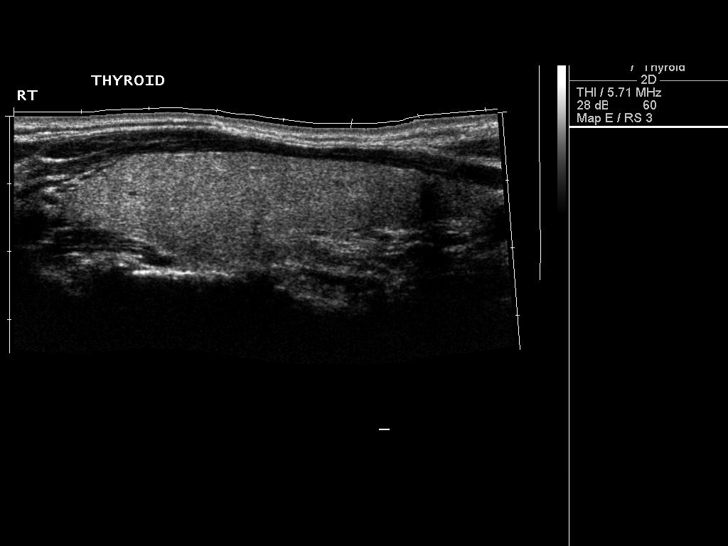
[im 29/54]
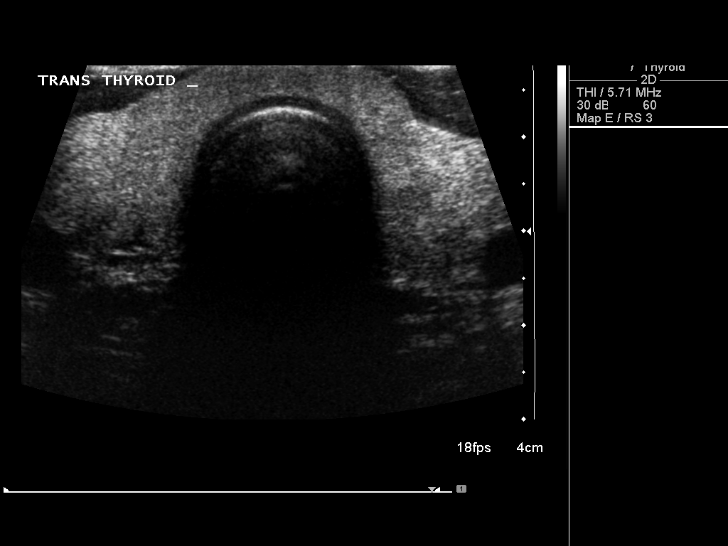
[im 34/54]
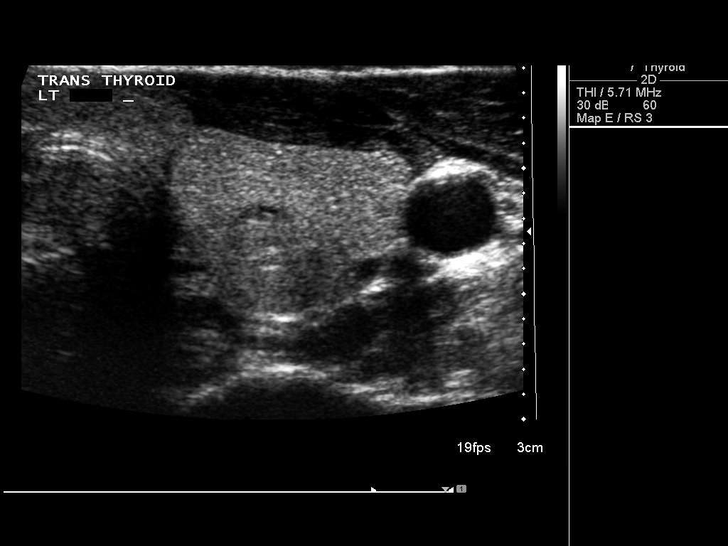
[im 36/54]
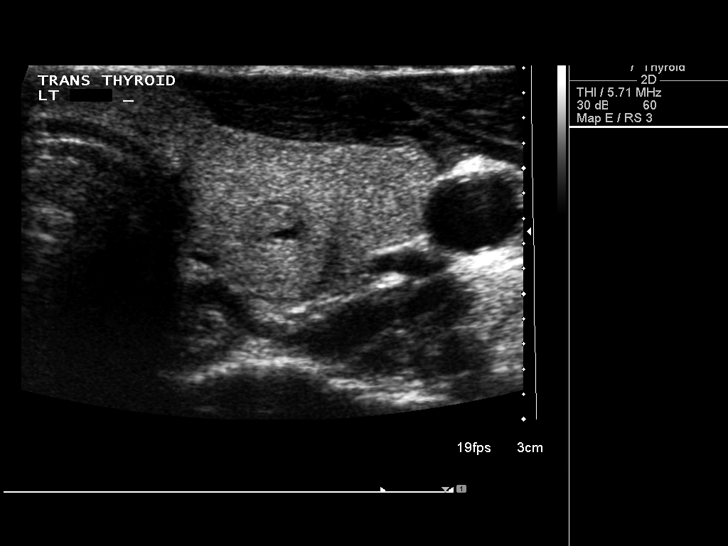
[im 40/54]
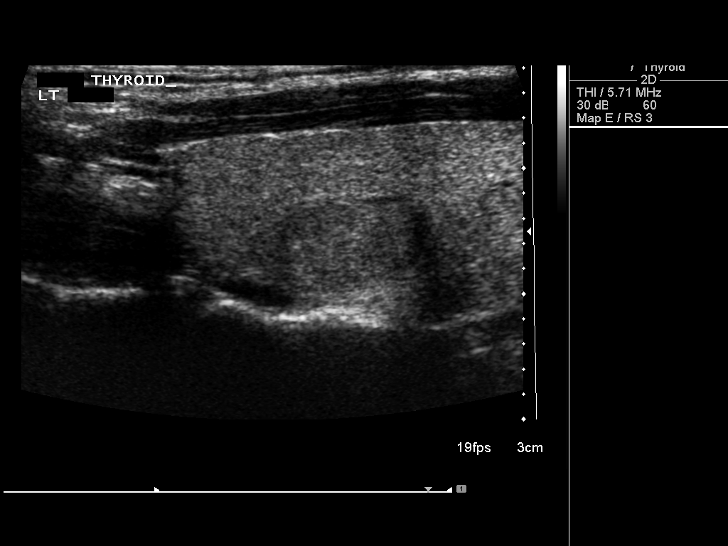
[im 45/54]
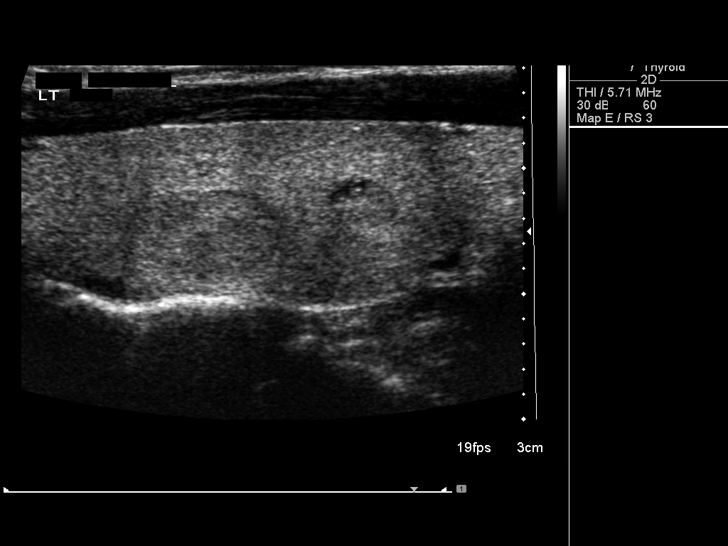
[im 49/54]
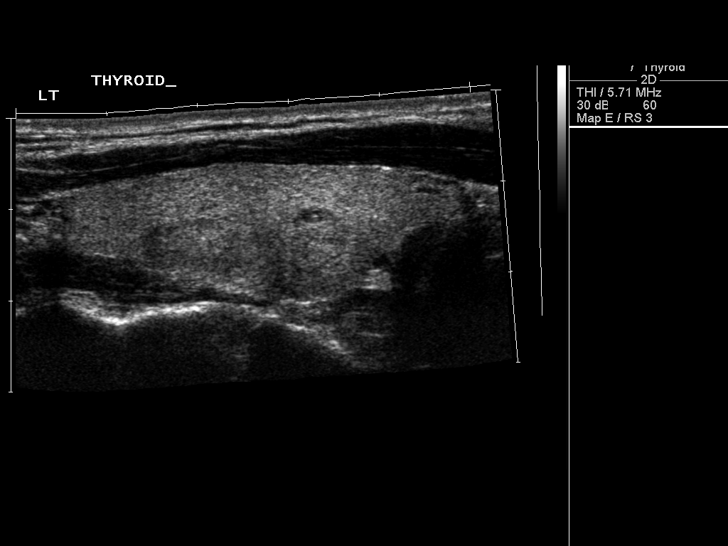
[im 54/54]
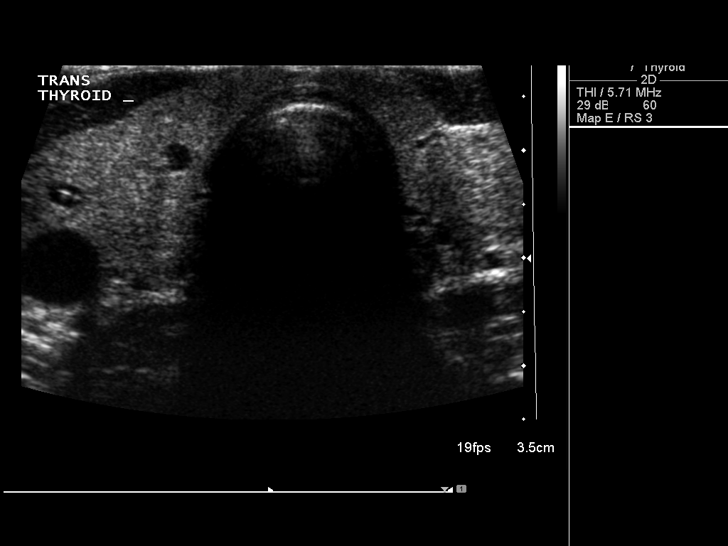

[14 of 25 positions shown; findings below may reference images not displayed]

FINDINGS: Right thyroid lobe:  6.52 x 1.6 x 2.3 cm.  (Previously 6.1 x 1.5 x
2.0 cm).
Left thyroid lobe:  5.1 x 1.6 x 1.9 cm.  (Previously 5.1 x 1.6 x
1.9 cm).
Isthmus:  4.1 mm in thickness.

Focal nodules:  The echogenicity of the thyroid gland is relatively
homogeneous.  There are nodules bilaterally.  The largest nodule is
solid within the left mid lobe and measures 1.3 x 0.7 x 1.1 cm
compared to prior measurements of 1.3 x 0.9 x 1.3 cm.  Additional
small nodules are noted bilaterally of 6 mm or less in size, many
of which are hypoechoic.

Lymphadenopathy:  None visualized.
IMPRESSION: Stable solid nodule in the left mid lobe of thyroid.  No enlarging
thyroid nodule is seen within this prominent thyroid gland.

## 2013-03-11 ENCOUNTER — Ambulatory Visit (INDEPENDENT_AMBULATORY_CARE_PROVIDER_SITE_OTHER): Payer: BC Managed Care – PPO | Admitting: Family Medicine

## 2013-03-11 ENCOUNTER — Encounter: Payer: Self-pay | Admitting: Family Medicine

## 2013-03-11 DIAGNOSIS — M25461 Effusion, right knee: Secondary | ICD-10-CM

## 2013-03-11 DIAGNOSIS — M25469 Effusion, unspecified knee: Secondary | ICD-10-CM

## 2013-03-11 NOTE — Progress Notes (Signed)
  Subjective:    Patient ID: Andrea Lamb, female    DOB: 30-Oct-1966, 47 y.o.   MRN: [id number]  HPI She is here for recheck. She does state that there is less swelling but she notes that her knee feels somewhat unstable. She has run twice and notes difficulty especially with going down hills.   Review of Systems     Objective:   Physical Exam Slight effusion is noted. Anterior drawer is still positive.       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 This could be the way she normally is. Discussed options including rehabilitation and surgery. If she truly has a deficient knee I would push more towards surgery since she is quite active and would like to remain that way. MRI will be ordered.

## 2013-03-15 ENCOUNTER — Ambulatory Visit
Admission: RE | Admit: 2013-03-15 | Discharge: 2013-03-15 | Disposition: A | Discharge status: Home / Self Care | Payer: BC Managed Care – PPO | Source: Ambulatory Visit | Attending: Family Medicine | Admitting: Family Medicine

## 2013-03-15 DIAGNOSIS — M25461 Effusion, right knee: Secondary | ICD-10-CM

## 2013-03-16 NOTE — Progress Notes (Signed)
Quick Note:  Left message on home # left word for word Have her schedule an appointment to see me concerning this ______

## 2013-03-17 ENCOUNTER — Ambulatory Visit (INDEPENDENT_AMBULATORY_CARE_PROVIDER_SITE_OTHER): Payer: BC Managed Care – PPO | Admitting: Family Medicine

## 2013-03-17 DIAGNOSIS — M1990 Unspecified osteoarthritis, unspecified site: Secondary | ICD-10-CM

## 2013-03-17 NOTE — Progress Notes (Signed)
  Subjective:    Patient ID: Andrea Lamb, female    DOB: June 12, 1966, 47 y.o.   MRN: [id number]  HPI She is here for consult after recent MRI did show an intact anterior cruciate ligament but some degenerative changes as well as possible meniscal damage.   Review of Systems     Objective:   Physical Exam Alert and in no distress otherwise not examined       Assessment & Plan:  DJD (degenerative joint disease) I discussed in detail the results of the MRI. Explained that there was definitely some degenerative changes as well as surgical changes with the meniscus and possible meniscal damage. Discussed options including continuing to rest it and to rehabilitation the knees especially doing hamstring exercises versus injection versus referral to surgery. She would like to do the conservative approach and give it rest and rehabilitation. If no improvement, she will return here for probable joint injection. At

## 2013-11-18 ENCOUNTER — Encounter: Payer: Self-pay | Admitting: Women's Health

## 2013-11-18 ENCOUNTER — Ambulatory Visit (INDEPENDENT_AMBULATORY_CARE_PROVIDER_SITE_OTHER): Payer: BC Managed Care – PPO | Admitting: Women's Health

## 2013-11-18 ENCOUNTER — Other Ambulatory Visit (HOSPITAL_COMMUNITY)
Admission: RE | Admit: 2013-11-18 | Discharge: 2013-11-18 | Disposition: A | Discharge status: Home / Self Care | Payer: BC Managed Care – PPO | Source: Ambulatory Visit | Attending: Gynecology | Admitting: Gynecology

## 2013-11-18 VITALS — BP 106/68 | Ht 64.0 in | Wt 137.4 lb

## 2013-11-18 DIAGNOSIS — Z01419 Encounter for gynecological examination (general) (routine) without abnormal findings: Secondary | ICD-10-CM

## 2013-11-18 NOTE — Patient Instructions (Signed)

## 2013-11-18 NOTE — Addendum Note (Signed)
Addended by: Alen Blew on: 11/18/2013 02:17 PM   Modules accepted: Orders

## 2013-11-18 NOTE — Progress Notes (Signed)
Andrea Lamb [birth year] [id number]    History:    The patient presents for annual exam.  Monthly cycles/vasectomy. LGSIL 2006/and 2010 negative biopsy with normal Paps after. Dr. Deirdre Pippins primary care does labs and breast ultrasound. Refuses mammogram. Basal skin cancer history.  Past medical history, past surgical history, family history and social history were all reviewed and documented in the EPIC chart. Works part time with [employer]. Alissa 11, Sophia 9, both doing well. Maternal grandmother, maternal and breast cancer.  ROS:  A  ROS was performed and pertinent positives and negatives are included in the history.  Exam:  Filed Vitals:   11/18/13 1206  BP: 106/68    General appearance:  Normal Head/Neck:  Normal, without cervical or supraclavicular adenopathy. Thyroid:  Symmetrical, normal in size, without palpable masses or nodularity. Respiratory  Effort:  Normal  Auscultation:  Clear without wheezing or rhonchi Cardiovascular  Auscultation:  Regular rate, without rubs, murmurs or gallops  Edema/varicosities:  Not grossly evident Abdominal  Soft,nontender, without masses, guarding or rebound.  Liver/spleen:  No organomegaly noted  Hernia:  None appreciated  Skin  Inspection:  Grossly normal  Palpation:  Grossly normal Neurologic/psychiatric  Orientation:  Normal with appropriate conversation.  Mood/affect:  Normal  Genitourinary    Breasts: Examined lying and sitting/saline implants.     Right: Without masses, retractions, discharge or axillary adenopathy.     Left: Without masses, retractions, discharge or axillary adenopathy.   Inguinal/mons:  Normal without inguinal adenopathy  External genitalia:  Normal  BUS/Urethra/Skene's glands:  Normal  Bladder:  Normal  Vagina:  Normal  Cervix:  Normal  Uterus:   normal in size, shape and contour.  Midline and mobile  Adnexa/parametria:     Rt: Without masses or tenderness.   Lt: Without masses or  tenderness.  Anus and perineum: Normal  Digital rectal exam: Normal sphincter tone without palpated masses or tenderness  Assessment/Plan:  48 y.o.MWF G2P2  for annual exam with no complaints.  2010 LGSIL/negative biopsy /normal Paps after Vasectomy Basal skin cancer Functional heart murmur  Plan: SBE's, reviewed importance of annual screening mammogram, reviewed ACOG's recommendations, declines will continue annual ultrasound. Continue regular exercise, calcium rich diet, vitamin D 1000 daily, Pap. Pap normal 2012, new screening guidelines reviewed. Continue annual skin check.  [place], 1:00 PM 11/18/2013

## 2013-11-22 ENCOUNTER — Encounter: Payer: Self-pay | Admitting: Gynecology

## 2014-09-12 ENCOUNTER — Encounter: Payer: Self-pay | Admitting: Women's Health

## 2014-12-08 ENCOUNTER — Telehealth: Payer: Self-pay | Admitting: Internal Medicine

## 2014-12-08 NOTE — Telephone Encounter (Signed)
Patient verbalized understanding  

## 2014-12-08 NOTE — Telephone Encounter (Signed)
Have her continue with the warm compresses and the antibiotic

## 2014-12-08 NOTE — Telephone Encounter (Signed)
Pt called stating that she went to the urgent care for styes in both eyes last week, they went away and then one popped back up yesterday. She has been using the drops 4x a day in each eye and doing warm compresses. However the other day she did miss one dose and she is not sure if that was the reason for another stye to come back. She was offered an appt but does not want to come in to be told to continue same med or warm compresses so she wanted me to send a message to you first.

## 2015-10-28 HISTORY — PX: ROTATOR CUFF REPAIR: SHX139

## 2016-02-06 ENCOUNTER — Encounter: Payer: Self-pay | Admitting: Women's Health

## 2016-02-06 ENCOUNTER — Ambulatory Visit (INDEPENDENT_AMBULATORY_CARE_PROVIDER_SITE_OTHER): Payer: Managed Care, Other (non HMO) | Admitting: Women's Health

## 2016-02-06 VITALS — BP 118/80 | Ht 64.0 in | Wt 138.0 lb

## 2016-02-06 DIAGNOSIS — Z01419 Encounter for gynecological examination (general) (routine) without abnormal findings: Secondary | ICD-10-CM | POA: Diagnosis not present

## 2016-02-06 NOTE — Patient Instructions (Signed)
Human Papillomavirus Quadrivalent Vaccine suspension for injection What is this medicine? HUMAN PAPILLOMAVIRUS VACCINE (HYOO muhn pap uh LOH muh vahy ruhs vak SEEN) is a vaccine. It is used to prevent infections of four types of the human papillomavirus. In women, the vaccine may lower your risk of getting cervical, vaginal, vulvar, or anal cancer and genital warts. In men, the vaccine may lower your risk of getting genital warts and anal cancer. You cannot get these diseases from the vaccine. This vaccine does not treat these diseases. This medicine may be used for other purposes; ask your health care provider or pharmacist if you have questions. What should I tell my health care provider before I take this medicine? They need to know if you have any of these conditions: -fever or infection -hemophilia -HIV infection or AIDS -immune system problems -low platelet count -an unusual reaction to Human Papillomavirus Vaccine, yeast, other medicines, foods, dyes, or preservatives -pregnant or trying to get pregnant -breast-feeding How should I use this medicine? This vaccine is for injection in a muscle on your upper arm or thigh. It is given by a health care professional. Andrea Lamb will be observed for 15 minutes after each dose. Sometimes, fainting happens after the vaccine is given. You may be asked to sit or lie down during the 15 minutes. Three doses are given. The second dose is given 2 months after the first dose. The last dose is given 4 months after the second dose. A copy of a Vaccine Information Statement will be given before each vaccination. Read this sheet carefully each time. The sheet may change frequently. Talk to your pediatrician regarding the use of this medicine in children. While this drug may be prescribed for children as Andrea Lamb as 48 years of age for selected conditions, precautions do apply. Overdosage: If you think you have taken too much of this medicine contact a poison control  center or emergency room at once. NOTE: This medicine is only for you. Do not share this medicine with others. What if I miss a dose? All 3 doses of the vaccine should be given within 6 months. Remember to keep appointments for follow-up doses. Your health care provider will tell you when to return for the next vaccine. Ask your health care professional for advice if you are unable to keep an appointment or miss a scheduled dose. What may interact with this medicine? -other vaccines This list may not describe all possible interactions. Give your health care provider a list of all the medicines, herbs, non-prescription drugs, or dietary supplements you use. Also tell them if you smoke, drink alcohol, or use illegal drugs. Some items may interact with your medicine. What should I watch for while using this medicine? This vaccine may not fully protect everyone. Continue to have regular pelvic exams and cervical or anal cancer screenings as directed by your doctor. The Human Papillomavirus is a sexually transmitted disease. It can be passed by any kind of sexual activity that involves genital contact. The vaccine works best when given before you have any contact with the virus. Many people who have the virus do not have any signs or symptoms. Tell your doctor or health care professional if you have any reaction or unusual symptom after getting the vaccine. What side effects may I notice from receiving this medicine? Side effects that you should report to your doctor or health care professional as soon as possible: -allergic reactions like skin rash, itching or hives, swelling of the face, lips,  or tongue -breathing problems -feeling faint or lightheaded, falls Side effects that usually do not require medical attention (report to your doctor or health care professional if they continue or are bothersome): -cough -dizziness -fever -headache -nausea -redness, warmth, swelling, pain, or itching at site  where injected This list may not describe all possible side effects. Call your doctor for medical advice about side effects. You may report side effects to FDA at 1-800-FDA-1088. Where should I keep my medicine? This drug is given in a hospital or clinic and will not be stored at home. NOTE: This sheet is a summary. It may not cover all possible information. If you have questions about this medicine, talk to your doctor, pharmacist, or health care provider.    2016, Elsevier/Gold Standard. (2013-12-20 13:14:33) Health Maintenance, Female Adopting a healthy lifestyle and getting preventive care can go a long way to promote health and wellness. Talk with your health care provider about what schedule of regular examinations is right for you. This is a good chance for you to check in with your provider about disease prevention and staying healthy. In between checkups, there are plenty of things you can do on your own. Experts have done a lot of research about which lifestyle changes and preventive measures are most likely to keep you healthy. Ask your health care provider for more information. WEIGHT AND DIET  Eat a healthy diet  Be sure to include plenty of vegetables, fruits, low-fat dairy products, and lean protein.  Do not eat a lot of foods high in solid fats, added sugars, or salt.  Get regular exercise. This is one of the most important things you can do for your health.  Most adults should exercise for at least 150 minutes each week. The exercise should increase your heart rate and make you sweat (moderate-intensity exercise).  Most adults should also do strengthening exercises at least twice a week. This is in addition to the moderate-intensity exercise.  Maintain a healthy weight  Body mass index (BMI) is a measurement that can be used to identify possible weight problems. It estimates body fat based on height and weight. Your health care provider can help determine your BMI and help  you achieve or maintain a healthy weight.  For females 77 years of age and older:   A BMI below 18.5 is considered underweight.  A BMI of 18.5 to 24.9 is normal.  A BMI of 25 to 29.9 is considered overweight.  A BMI of 30 and above is considered obese.  Watch levels of cholesterol and blood lipids  You should start having your blood tested for lipids and cholesterol at 48 years of age, then have this test every 5 years.  You may need to have your cholesterol levels checked more often if:  Your lipid or cholesterol levels are high.  You are older than 48 years of age.  You are at high risk for heart disease.  CANCER SCREENING   Lung Cancer  Lung cancer screening is recommended for adults 53-63 years old who are at high risk for lung cancer because of a history of smoking.  A yearly low-dose CT scan of the lungs is recommended for people who:  Currently smoke.  Have quit within the past 15 years.  Have at least a 30-pack-year history of smoking. A pack year is smoking an average of one pack of cigarettes a day for 1 year.  Yearly screening should continue until it has been 15 years since you  quit.  Yearly screening should stop if you develop a health problem that would prevent you from having lung cancer treatment.  Breast Cancer  Practice breast self-awareness. This means understanding how your breasts normally appear and feel.  It also means doing regular breast self-exams. Let your health care provider know about any changes, no matter how small.  If you are in your 20s or 30s, you should have a clinical breast exam (CBE) by a health care provider every 1-3 years as part of a regular health exam.  If you are 40 or older, have a CBE every year. Also consider having a breast X-ray (mammogram) every year.  If you have a family history of breast cancer, talk to your health care provider about genetic screening.  If you are at high risk for breast cancer, talk to  your health care provider about having an MRI and a mammogram every year.  Breast cancer gene (BRCA) assessment is recommended for women who have family members with BRCA-related cancers. BRCA-related cancers include:  Breast.  Ovarian.  Tubal.  Peritoneal cancers.  Results of the assessment will determine the need for genetic counseling and BRCA1 and BRCA2 testing. Cervical Cancer Your health care provider may recommend that you be screened regularly for cancer of the pelvic organs (ovaries, uterus, and vagina). This screening involves a pelvic examination, including checking for microscopic changes to the surface of your cervix (Pap test). You may be encouraged to have this screening done every 3 years, beginning at age 48.  For women ages 34-65, health care providers may recommend pelvic exams and Pap testing every 3 years, or they may recommend the Pap and pelvic exam, combined with testing for human papilloma virus (HPV), every 5 years. Some types of HPV increase your risk of cervical cancer. Testing for HPV may also be done on women of any age with unclear Pap test results.  Other health care providers may not recommend any screening for nonpregnant women who are considered low risk for pelvic cancer and who do not have symptoms. Ask your health care provider if a screening pelvic exam is right for you.  If you have had past treatment for cervical cancer or a condition that could lead to cancer, you need Pap tests and screening for cancer for at least 20 years after your treatment. If Pap tests have been discontinued, your risk factors (such as having a new sexual partner) need to be reassessed to determine if screening should resume. Some women have medical problems that increase the chance of getting cervical cancer. In these cases, your health care provider may recommend more frequent screening and Pap tests. Colorectal Cancer  This type of cancer can be detected and often  prevented.  Routine colorectal cancer screening usually begins at 48 years of age and continues through 48 years of age.  Your health care provider may recommend screening at an earlier age if you have risk factors for colon cancer.  Your health care provider may also recommend using home test kits to check for hidden blood in the stool.  A small camera at the end of a tube can be used to examine your colon directly (sigmoidoscopy or colonoscopy). This is done to check for the earliest forms of colorectal cancer.  Routine screening usually begins at age 7.  Direct examination of the colon should be repeated every 5-10 years through 48 years of age. However, you may need to be screened more often if early forms of precancerous  polyps or small growths are found. Skin Cancer  Check your skin from head to toe regularly.  Tell your health care provider about any new moles or changes in moles, especially if there is a change in a mole's shape or color.  Also tell your health care provider if you have a mole that is larger than the size of a pencil eraser.  Always use sunscreen. Apply sunscreen liberally and repeatedly throughout the day.  Protect yourself by wearing long sleeves, pants, a wide-brimmed hat, and sunglasses whenever you are outside. HEART DISEASE, DIABETES, AND HIGH BLOOD PRESSURE   High blood pressure causes heart disease and increases the risk of stroke. High blood pressure is more likely to develop in:  People who have blood pressure in the high end of the normal range (130-139/85-89 mm Hg).  People who are overweight or obese.  People who are African American.  If you are 69-69 years of age, have your blood pressure checked every 3-5 years. If you are 23 years of age or older, have your blood pressure checked every year. You should have your blood pressure measured twice--once when you are at a hospital or clinic, and once when you are not at a hospital or clinic.  Record the average of the two measurements. To check your blood pressure when you are not at a hospital or clinic, you can use:  An automated blood pressure machine at a pharmacy.  A home blood pressure monitor.  If you are between 44 years and 29 years old, ask your health care provider if you should take aspirin to prevent strokes.  Have regular diabetes screenings. This involves taking a blood sample to check your fasting blood sugar level.  If you are at a normal weight and have a low risk for diabetes, have this test once every three years after 48 years of age.  If you are overweight and have a high risk for diabetes, consider being tested at a younger age or more often. PREVENTING INFECTION  Hepatitis B  If you have a higher risk for hepatitis B, you should be screened for this virus. You are considered at high risk for hepatitis B if:  You were born in a country where hepatitis B is common. Ask your health care provider which countries are considered high risk.  Your parents were born in a high-risk country, and you have not been immunized against hepatitis B (hepatitis B vaccine).  You have HIV or AIDS.  You use needles to inject street drugs.  You live with someone who has hepatitis B.  You have had sex with someone who has hepatitis B.  You get hemodialysis treatment.  You take certain medicines for conditions, including cancer, organ transplantation, and autoimmune conditions. Hepatitis C  Blood testing is recommended for:  Everyone born from [birth year] through 1963.  Anyone with known risk factors for hepatitis C. Sexually transmitted infections (STIs)  You should be screened for sexually transmitted infections (STIs) including gonorrhea and chlamydia if:  You are sexually active and are younger than 48 years of age.  You are older than 48 years of age and your health care provider tells you that you are at risk for this type of infection.  Your sexual activity  has changed since you were last screened and you are at an increased risk for chlamydia or gonorrhea. Ask your health care provider if you are at risk.  If you do not have HIV, but are at risk, it  may be recommended that you take a prescription medicine daily to prevent HIV infection. This is called pre-exposure prophylaxis (PrEP). You are considered at risk if:  You are sexually active and do not regularly use condoms or know the HIV status of your partner(s).  You take drugs by injection.  You are sexually active with a partner who has HIV. Talk with your health care provider about whether you are at high risk of being infected with HIV. If you choose to begin PrEP, you should first be tested for HIV. You should then be tested every 3 months for as long as you are taking PrEP.  PREGNANCY   If you are premenopausal and you may become pregnant, ask your health care provider about preconception counseling.  If you may become pregnant, take 400 to 800 micrograms (mcg) of folic acid every day.  If you want to prevent pregnancy, talk to your health care provider about birth control (contraception). OSTEOPOROSIS AND MENOPAUSE   Osteoporosis is a disease in which the bones lose minerals and strength with aging. This can result in serious bone fractures. Your risk for osteoporosis can be identified using a bone density scan.  If you are 62 years of age or older, or if you are at risk for osteoporosis and fractures, ask your health care provider if you should be screened.  Ask your health care provider whether you should take a calcium or vitamin D supplement to lower your risk for osteoporosis.  Menopause may have certain physical symptoms and risks.  Hormone replacement therapy may reduce some of these symptoms and risks. Talk to your health care provider about whether hormone replacement therapy is right for you.  HOME CARE INSTRUCTIONS   Schedule regular health, dental, and eye  exams.  Stay current with your immunizations.   Do not use any tobacco products including cigarettes, chewing tobacco, or electronic cigarettes.  If you are pregnant, do not drink alcohol.  If you are breastfeeding, limit how much and how often you drink alcohol.  Limit alcohol intake to no more than 1 drink per day for nonpregnant women. One drink equals 12 ounces of beer, 5 ounces of wine, or 1 ounces of hard liquor.  Do not use street drugs.  Do not share needles.  Ask your health care provider for help if you need support or information about quitting drugs.  Tell your health care provider if you often feel depressed.  Tell your health care provider if you have ever been abused or do not feel safe at home.   This information is not intended to replace advice given to you by your health care provider. Make sure you discuss any questions you have with your health care provider.   Document Released: 05/13/2011 Document Revised: 11/18/2014 Document Reviewed: 09/29/2013 Elsevier Interactive Patient Education [employer].

## 2016-02-06 NOTE — Progress Notes (Addendum)
Andrea Lamb [birth year] [id number]    History:    Presents for annual exam.  Regular monthly 5 day cycle first day heavy changing every hour/vasectomy. 2006, 2007 LGSIL with negative colposcopy, 2010 positive HR HPV with negative biopsy. Normal Paps after. Had a normal baseline mammogram but refuses  Mammogram now, has had breast ultrasounds. Labs at primary care reports as normal. 07/2011 history of a thyroid nodule with normal follow-up.  Past medical history, past surgical history, family history and social history were all reviewed and documented in the EPIC chart. Works for [employer] part-time. Alyssa 16, Sophia 12 both doing well. Parents healthy.  ROS:  A ROS was performed and pertinent positives and negatives are included.  Exam:  Filed Vitals:   02/06/16 1154  BP: 118/80    General appearance:  Normal Thyroid:  Symmetrical, normal in size, without palpable masses or nodularity. Respiratory  Auscultation:  Clear without wheezing or rhonchi Cardiovascular  Auscultation:  Regular rate, without rubs, murmurs or gallops  Edema/varicosities:  Not grossly evident Abdominal  Soft,nontender, without masses, guarding or rebound.  Liver/spleen:  No organomegaly noted  Hernia:  None appreciated  Skin  Inspection:  Grossly normal Numerous freckles and moles   Breasts: Examined lying and sitting. Bilateral implants    Right: Without masses, retractions, discharge or axillary adenopathy.     Left: Without masses, retractions, discharge or axillary adenopathy. Gentitourinary   Inguinal/mons:  Normal without inguinal adenopathy  External genitalia:  Normal, several small dark flat freckles/moles,  BUS/Urethra/Skene's glands:  Normal  Vagina:  Normal  Cervix:  Normal  Uterus:   normal in size, shape and contour.  Midline and mobile  Adnexa/parametria:     Rt: Without masses or tenderness.   Lt: Without masses or tenderness.  Anus and perineum: Normal  Digital rectal  exam: Normal sphincter tone without palpated masses or tenderness  Assessment/Plan:  50 y.o.MWF G2P2  for annual exam.    Monthly 5 day cycle first day heavy/vasectomy 2006, 2007 LGSIL, 2010 positive HR HPV normal Paps after History of basal skin cancer-face has annual skin checks Labs-primary care  Plan: Reviewed has numerous moles/pigmentations over body, less than 1 cm flat freckles/mole on perineum. Instructed to call if change. SBE's, strongly encouraged 3-D screening mammogram, annual screen, declines at this time. Continue healthy lifestyle of diet and exercise. Options reviewed for heavy cycle/ablation or medication declines at this time. UA, Pap with HR HPV typing. Marland Kitchen    [place], 12:48 PM 02/06/2016

## 2016-02-07 LAB — URINALYSIS W MICROSCOPIC + REFLEX CULTURE
BILIRUBIN URINE: NEGATIVE
Bacteria, UA: NONE SEEN [HPF]
CASTS: NONE SEEN [LPF]
CRYSTALS: NONE SEEN [HPF]
Glucose, UA: NEGATIVE
Hgb urine dipstick: NEGATIVE
KETONES UR: NEGATIVE
Leukocytes, UA: NEGATIVE
NITRITE: NEGATIVE
PH: 5.5 (ref 5.0–8.0)
Protein, ur: NEGATIVE
RBC / HPF: NONE SEEN RBC/HPF (ref <=2)
SPECIFIC GRAVITY, URINE: 1.004 (ref 1.001–1.035)
Squamous Epithelial / LPF: NONE SEEN [HPF] (ref <=5)
WBC UA: NONE SEEN WBC/HPF (ref <=5)
Yeast: NONE SEEN [HPF]

## 2016-02-08 LAB — PAP, TP IMAGING W/ HPV RNA, RFLX HPV TYPE 16,18/45: HPV MRNA, HIGH RISK: NOT DETECTED

## 2017-01-03 ENCOUNTER — Ambulatory Visit (INDEPENDENT_AMBULATORY_CARE_PROVIDER_SITE_OTHER): Payer: 59 | Admitting: Women's Health

## 2017-01-03 ENCOUNTER — Encounter: Payer: Self-pay | Admitting: Women's Health

## 2017-01-03 VITALS — BP 102/68

## 2017-01-03 DIAGNOSIS — L739 Follicular disorder, unspecified: Secondary | ICD-10-CM | POA: Diagnosis not present

## 2017-01-03 NOTE — Progress Notes (Signed)
Presents with complaint of questionable wart near rectum area for a few days. Reports previous wart in the same area in college that was frozen off with no recurrence. Denies pain, bleeding, abnormal vaginal discharge or odor. Monthly cycles/vasectomy. Denies any problems with discharge, urinary symptoms, abdominal pain or fever  Exam: Appears well. Anus- Normal mucosa, tiny external hemorrhoid. No wart visualized. Small Ingrown hair presented as wart.  Folliculitis  Plan: Ingrown hair - scant amount of a dry exudate expressed, area flat, no visible erythema. Advised to avoid skin irritation, use shaving cream. Instructed to call if she has any questions or concerns. Reassurance given that no visible warts noted.

## 2017-01-03 NOTE — Patient Instructions (Signed)

## 2017-02-18 ENCOUNTER — Encounter: Payer: Self-pay | Admitting: Women's Health

## 2017-02-18 ENCOUNTER — Ambulatory Visit (INDEPENDENT_AMBULATORY_CARE_PROVIDER_SITE_OTHER): Payer: 59 | Admitting: Women's Health

## 2017-02-18 VITALS — BP 104/76 | Ht 63.75 in | Wt 140.0 lb

## 2017-02-18 DIAGNOSIS — Z01419 Encounter for gynecological examination (general) (routine) without abnormal findings: Secondary | ICD-10-CM | POA: Diagnosis not present

## 2017-02-18 DIAGNOSIS — Z13220 Encounter for screening for lipoid disorders: Secondary | ICD-10-CM

## 2017-02-18 LAB — CBC WITH DIFFERENTIAL/PLATELET
Basophils Absolute: 0 cells/uL (ref 0–200)
Basophils Relative: 0 %
EOS PCT: 0 %
Eosinophils Absolute: 0 cells/uL — ABNORMAL LOW (ref 15–500)
HEMATOCRIT: 39.6 % (ref 35.0–45.0)
HEMOGLOBIN: 12.8 g/dL (ref 11.7–15.5)
LYMPHS ABS: 1300 {cells}/uL (ref 850–3900)
Lymphocytes Relative: 25 %
MCH: 31.6 pg (ref 27.0–33.0)
MCHC: 32.3 g/dL (ref 32.0–36.0)
MCV: 97.8 fL (ref 80.0–100.0)
MONO ABS: 416 {cells}/uL (ref 200–950)
MPV: 9.9 fL (ref 7.5–12.5)
Monocytes Relative: 8 %
NEUTROS PCT: 67 %
Neutro Abs: 3484 cells/uL (ref 1500–7800)
Platelets: 269 10*3/uL (ref 140–400)
RBC: 4.05 MIL/uL (ref 3.80–5.10)
RDW: 13.2 % (ref 11.0–15.0)
WBC: 5.2 10*3/uL (ref 3.8–10.8)

## 2017-02-18 LAB — LIPID PANEL
CHOL/HDL RATIO: 2 ratio (ref <5.0)
CHOLESTEROL: 168 mg/dL (ref <200)
HDL: 83 mg/dL (ref >50)
LDL CALC: 71 mg/dL (ref <100)
TRIGLYCERIDES: 71 mg/dL (ref <150)
VLDL: 14 mg/dL (ref <30)

## 2017-02-18 LAB — COMPREHENSIVE METABOLIC PANEL
ALBUMIN: 4.2 g/dL (ref 3.6–5.1)
ALT: 15 U/L (ref 6–29)
AST: 26 U/L (ref 10–35)
Alkaline Phosphatase: 43 U/L (ref 33–115)
BUN: 15 mg/dL (ref 7–25)
CALCIUM: 9.3 mg/dL (ref 8.6–10.2)
CO2: 26 mmol/L (ref 20–31)
Chloride: 103 mmol/L (ref 98–110)
Creat: 0.95 mg/dL (ref 0.50–1.10)
Glucose, Bld: 100 mg/dL — ABNORMAL HIGH (ref 65–99)
Potassium: 3.8 mmol/L (ref 3.5–5.3)
Sodium: 138 mmol/L (ref 135–146)
TOTAL PROTEIN: 6.4 g/dL (ref 6.1–8.1)
Total Bilirubin: 0.3 mg/dL (ref 0.2–1.2)

## 2017-02-18 NOTE — Patient Instructions (Signed)
Health Maintenance, Female Adopting a healthy lifestyle and getting preventive care can go a long way to promote health and wellness. Talk with your health care provider about what schedule of regular examinations is right for you. This is a good chance for you to check in with your provider about disease prevention and staying healthy. In between checkups, there are plenty of things you can do on your own. Experts have done a lot of research about which lifestyle changes and preventive measures are most likely to keep you healthy. Ask your health care provider for more information. Weight and diet Eat a healthy diet  Be sure to include plenty of vegetables, fruits, low-fat dairy products, and lean protein.  Do not eat a lot of foods high in solid fats, added sugars, or salt.  Get regular exercise. This is one of the most important things you can do for your health.  Most adults should exercise for at least 150 minutes each week. The exercise should increase your heart rate and make you sweat (moderate-intensity exercise).  Most adults should also do strengthening exercises at least twice a week. This is in addition to the moderate-intensity exercise. Maintain a healthy weight  Body mass index (BMI) is a measurement that can be used to identify possible weight problems. It estimates body fat based on height and weight. Your health care provider can help determine your BMI and help you achieve or maintain a healthy weight.  For females 76 years of age and older:  A BMI below 18.5 is considered underweight.  A BMI of 18.5 to 24.9 is normal.  A BMI of 25 to 29.9 is considered overweight.  A BMI of 30 and above is considered obese. Watch levels of cholesterol and blood lipids  You should start having your blood tested for lipids and cholesterol at 49 years of age, then have this test every 5 years.  You may need to have your cholesterol levels checked more often if:  Your lipid or  cholesterol levels are high.  You are older than 49 years of age.  You are at high risk for heart disease. Cancer screening Lung Cancer  Lung cancer screening is recommended for adults 64-42 years old who are at high risk for lung cancer because of a history of smoking.  A yearly low-dose CT scan of the lungs is recommended for people who:  Currently smoke.  Have quit within the past 15 years.  Have at least a 30-pack-year history of smoking. A pack year is smoking an average of one pack of cigarettes a day for 1 year.  Yearly screening should continue until it has been 15 years since you quit.  Yearly screening should stop if you develop a health problem that would prevent you from having lung cancer treatment. Breast Cancer  Practice breast self-awareness. This means understanding how your breasts normally appear and feel.  It also means doing regular breast self-exams. Let your health care provider know about any changes, no matter how small.  If you are in your 20s or 30s, you should have a clinical breast exam (CBE) by a health care provider every 1-3 years as part of a regular health exam.  If you are 34 or older, have a CBE every year. Also consider having a breast X-ray (mammogram) every year.  If you have a family history of breast cancer, talk to your health care provider about genetic screening.  If you are at high risk for breast cancer, talk  to your health care provider about having an MRI and a mammogram every year.  Breast cancer gene (BRCA) assessment is recommended for women who have family members with BRCA-related cancers. BRCA-related cancers include:  Breast.  Ovarian.  Tubal.  Peritoneal cancers.  Results of the assessment will determine the need for genetic counseling and BRCA1 and BRCA2 testing. Cervical Cancer  Your health care provider may recommend that you be screened regularly for cancer of the pelvic organs (ovaries, uterus, and vagina).  This screening involves a pelvic examination, including checking for microscopic changes to the surface of your cervix (Pap test). You may be encouraged to have this screening done every 3 years, beginning at age 24.  For women ages 66-65, health care providers may recommend pelvic exams and Pap testing every 3 years, or they may recommend the Pap and pelvic exam, combined with testing for human papilloma virus (HPV), every 5 years. Some types of HPV increase your risk of cervical cancer. Testing for HPV may also be done on women of any age with unclear Pap test results.  Other health care providers may not recommend any screening for nonpregnant women who are considered low risk for pelvic cancer and who do not have symptoms. Ask your health care provider if a screening pelvic exam is right for you.  If you have had past treatment for cervical cancer or a condition that could lead to cancer, you need Pap tests and screening for cancer for at least 20 years after your treatment. If Pap tests have been discontinued, your risk factors (such as having a new sexual partner) need to be reassessed to determine if screening should resume. Some women have medical problems that increase the chance of getting cervical cancer. In these cases, your health care provider may recommend more frequent screening and Pap tests. Colorectal Cancer  This type of cancer can be detected and often prevented.  Routine colorectal cancer screening usually begins at 49 years of age and continues through 49 years of age.  Your health care provider may recommend screening at an earlier age if you have risk factors for colon cancer.  Your health care provider may also recommend using home test kits to check for hidden blood in the stool.  A small camera at the end of a tube can be used to examine your colon directly (sigmoidoscopy or colonoscopy). This is done to check for the earliest forms of colorectal cancer.  Routine  screening usually begins at age 41.  Direct examination of the colon should be repeated every 5-10 years through 49 years of age. However, you may need to be screened more often if early forms of precancerous polyps or small growths are found. Skin Cancer  Check your skin from head to toe regularly.  Tell your health care provider about any new moles or changes in moles, especially if there is a change in a mole's shape or color.  Also tell your health care provider if you have a mole that is larger than the size of a pencil eraser.  Always use sunscreen. Apply sunscreen liberally and repeatedly throughout the day.  Protect yourself by wearing long sleeves, pants, a wide-brimmed hat, and sunglasses whenever you are outside. Heart disease, diabetes, and high blood pressure  High blood pressure causes heart disease and increases the risk of stroke. High blood pressure is more likely to develop in:  People who have blood pressure in the high end of the normal range (130-139/85-89 mm Hg).  People who are overweight or obese.  People who are African American.  If you are 59-24 years of age, have your blood pressure checked every 3-5 years. If you are 34 years of age or older, have your blood pressure checked every year. You should have your blood pressure measured twice-once when you are at a hospital or clinic, and once when you are not at a hospital or clinic. Record the average of the two measurements. To check your blood pressure when you are not at a hospital or clinic, you can use:  An automated blood pressure machine at a pharmacy.  A home blood pressure monitor.  If you are between 29 years and 60 years old, ask your health care provider if you should take aspirin to prevent strokes.  Have regular diabetes screenings. This involves taking a blood sample to check your fasting blood sugar level.  If you are at a normal weight and have a low risk for diabetes, have this test once  every three years after 49 years of age.  If you are overweight and have a high risk for diabetes, consider being tested at a younger age or more often. Preventing infection Hepatitis B  If you have a higher risk for hepatitis B, you should be screened for this virus. You are considered at high risk for hepatitis B if:  You were born in a country where hepatitis B is common. Ask your health care provider which countries are considered high risk.  Your parents were born in a high-risk country, and you have not been immunized against hepatitis B (hepatitis B vaccine).  You have HIV or AIDS.  You use needles to inject street drugs.  You live with someone who has hepatitis B.  You have had sex with someone who has hepatitis B.  You get hemodialysis treatment.  You take certain medicines for conditions, including cancer, organ transplantation, and autoimmune conditions. Hepatitis C  Blood testing is recommended for:  Everyone born from [birth year] through 1963.  Anyone with known risk factors for hepatitis C. Sexually transmitted infections (STIs)  You should be screened for sexually transmitted infections (STIs) including gonorrhea and chlamydia if:  You are sexually active and are younger than 49 years of age.  You are older than 49 years of age and your health care provider tells you that you are at risk for this type of infection.  Your sexual activity has changed since you were last screened and you are at an increased risk for chlamydia or gonorrhea. Ask your health care provider if you are at risk.  If you do not have HIV, but are at risk, it may be recommended that you take a prescription medicine daily to prevent HIV infection. This is called pre-exposure prophylaxis (PrEP). You are considered at risk if:  You are sexually active and do not regularly use condoms or know the HIV status of your partner(s).  You take drugs by injection.  You are sexually active with a partner  who has HIV. Talk with your health care provider about whether you are at high risk of being infected with HIV. If you choose to begin PrEP, you should first be tested for HIV. You should then be tested every 3 months for as long as you are taking PrEP. Pregnancy  If you are premenopausal and you may become pregnant, ask your health care provider about preconception counseling.  If you may become pregnant, take 400 to 800 micrograms (mcg) of folic acid  every day.  If you want to prevent pregnancy, talk to your health care provider about birth control (contraception). Osteoporosis and menopause  Osteoporosis is a disease in which the bones lose minerals and strength with aging. This can result in serious bone fractures. Your risk for osteoporosis can be identified using a bone density scan.  If you are 4 years of age or older, or if you are at risk for osteoporosis and fractures, ask your health care provider if you should be screened.  Ask your health care provider whether you should take a calcium or vitamin D supplement to lower your risk for osteoporosis.  Menopause may have certain physical symptoms and risks.  Hormone replacement therapy may reduce some of these symptoms and risks. Talk to your health care provider about whether hormone replacement therapy is right for you. Follow these instructions at home:  Schedule regular health, dental, and eye exams.  Stay current with your immunizations.  Do not use any tobacco products including cigarettes, chewing tobacco, or electronic cigarettes.  If you are pregnant, do not drink alcohol.  If you are breastfeeding, limit how much and how often you drink alcohol.  Limit alcohol intake to no more than 1 drink per day for nonpregnant women. One drink equals 12 ounces of beer, 5 ounces of wine, or 1 ounces of hard liquor.  Do not use street drugs.  Do not share needles.  Ask your health care provider for help if you need support  or information about quitting drugs.  Tell your health care provider if you often feel depressed.  Tell your health care provider if you have ever been abused or do not feel safe at home. This information is not intended to replace advice given to you by your health care provider. Make sure you discuss any questions you have with your health care provider. Document Released: 05/13/2011 Document Revised: 04/04/2016 Document Reviewed: 08/01/2015 Elsevier Interactive Patient Education  2017 [employer].

## 2017-02-18 NOTE — Progress Notes (Signed)
Andrea Lamb 1965/12/26 [id number]    History:    Presents for annual exam with no complaints. Regular 5 day monthly cycle, husband vasectomy. 2006, 2007 LGSIL with negative colposcopy. 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 Had normal baseline mammography but no screening since. 07/2011 declines only prefers tomography. Was having annual breast tomography at Dr. Sharol Roussel who is now on sabbatical. History of thyroid nodule with normal follow-up.  Past medical history, past surgical history, family history and social history were all reviewed and documented in the EPIC chart. Two daughters, Alyssa 21 and Sophia 13 doing well. 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  ROS:  A ROS was performed and pertinent positives and negatives are included.  Exam:  Vitals:   02/18/17 1218  BP: 104/76  Weight: 140 lb (63.5 kg)  Height: 5' 3.75" (1.619 m)   Body mass index is 24.22 kg/m.   General appearance:  Normal Thyroid:  Symmetrical, normal in size, without palpable masses or nodularity. Respiratory  Auscultation:  Clear without wheezing or rhonchi Cardiovascular  Auscultation:  Regular rate, without rubs, murmurs or gallops  Edema/varicosities:  Not grossly evident Abdominal  Soft,nontender, without masses, guarding or rebound.  Liver/spleen:  No organomegaly noted  Hernia:  nontender small umbilical hernia   Skin  Inspection:  Grossly normal   Breasts: Examined lying and sitting. Bilateral saline implants    Right: Without masses, retractions, discharge or axillary adenopathy.     Left: Without masses, retractions, discharge or axillary adenopathy. Gentitourinary   Inguinal/mons:  Normal without inguinal adenopathy  External genitalia:  Normal  BUS/Urethra/Skene's glands:  Normal  Vagina:  Normal  Cervix:  Normal  Uterus:  Normal in size, shape and contour.  Midline and mobile  Adnexa/parametria:     Rt: Without masses or tenderness.   Lt: Without masses or  tenderness.  Anus and perineum: Normal  Digital rectal exam: Normal sphincter tone without palpated masses or tenderness  Assessment/Plan:  51 y.o.  MWF G2P2 for annual exam with no complaints.     Monthly 5 day cycle/vasectomy 2006, 2007 LGSIL, 2010 positive HR HPV with negative C&B, normal Paps after History of basal skin cancer -  dermatologist  Plan: SBE's, encouraged annual mammograms, declines reviewed ACOG recommendations. continue healthy lifestyle of diet and exercise, Vitamin D 1000 daily. Encouraged follow-up with dermatologist for numerous moles. CBC, CMP, lipid panel. Pap normal with negative HR HPV 2017, will repeat next year.     Huel Cote [facility], 12:40 PM 02/18/2017

## 2017-12-31 ENCOUNTER — Other Ambulatory Visit: Payer: Self-pay | Admitting: Physician Assistant

## 2018-12-21 ENCOUNTER — Encounter: Payer: Self-pay | Admitting: Podiatry

## 2018-12-21 ENCOUNTER — Ambulatory Visit (INDEPENDENT_AMBULATORY_CARE_PROVIDER_SITE_OTHER): Payer: 59

## 2018-12-21 ENCOUNTER — Ambulatory Visit (INDEPENDENT_AMBULATORY_CARE_PROVIDER_SITE_OTHER): Payer: 59 | Admitting: Podiatry

## 2018-12-21 VITALS — BP 90/60

## 2018-12-21 DIAGNOSIS — M722 Plantar fascial fibromatosis: Secondary | ICD-10-CM

## 2018-12-21 MED ORDER — TRIAMCINOLONE ACETONIDE 10 MG/ML IJ SUSP
10.0000 mg | Freq: Once | INTRAMUSCULAR | Status: AC
  Administered 2018-12-21: 10 mg

## 2018-12-21 NOTE — Patient Instructions (Signed)

## 2018-12-21 NOTE — Progress Notes (Signed)
Subjective:   Patient ID: Andrea Lamb, female   DOB: 53 y.o.   MRN: [id number]   HPI Patient presents with exquisite discomfort plantar aspect right heel at the insertional point tendon into the calcaneus with inflammation fluid buildup noted.  Patient does not smoke and likes to be active   Review of Systems  All other systems reviewed and are negative.       Objective:  Physical Exam Vitals signs and nursing note reviewed.  Constitutional:      Appearance: She is well-developed.  Pulmonary:     Effort: Pulmonary effort is normal.  Musculoskeletal: Normal range of motion.  Skin:    General: Skin is warm.  Neurological:     Mental Status: She is alert.     Neurovascular status intact muscle strength adequate range of motion within normal limits with exquisite discomfort plantar aspect right heel at the insertional point of the tendon into the calcaneus with inflammation fluid around the medial band.  Patient is noted to have moderate depression of the arch and was found to have good digital perfusion well oriented x3     Assessment:  Acute plantar fasciitis right with also moderate bunion deformity noted nonsymptomatic with a very active person who likes to run     Plan:  H&P conditions reviewed and today I did sterile prep and then injected directly into the medial band of the right fascia 3 mg Kenalog 5 mg Xylocaine and applied fascial brace to lift the arch up with instructions on physical therapy supportive shoes and reappoint for [place] to recheck again in the next 4 weeks  X-rays indicated there is depression of the arch with no indication to stress fracture or advanced arthritis

## 2019-01-04 ENCOUNTER — Ambulatory Visit: Payer: 59 | Admitting: Podiatry

## 2019-08-24 ENCOUNTER — Other Ambulatory Visit: Payer: Self-pay

## 2019-08-25 ENCOUNTER — Encounter: Payer: 59 | Admitting: Women's Health

## 2020-02-24 ENCOUNTER — Other Ambulatory Visit: Payer: Self-pay

## 2020-02-24 ENCOUNTER — Encounter (INDEPENDENT_AMBULATORY_CARE_PROVIDER_SITE_OTHER): Payer: Self-pay

## 2020-02-24 ENCOUNTER — Encounter: Payer: Self-pay | Admitting: Physician Assistant

## 2020-02-24 ENCOUNTER — Ambulatory Visit (INDEPENDENT_AMBULATORY_CARE_PROVIDER_SITE_OTHER): Payer: 59 | Admitting: Physician Assistant

## 2020-02-24 DIAGNOSIS — Z1283 Encounter for screening for malignant neoplasm of skin: Secondary | ICD-10-CM | POA: Diagnosis not present

## 2020-02-24 DIAGNOSIS — L821 Other seborrheic keratosis: Secondary | ICD-10-CM

## 2020-02-24 NOTE — Progress Notes (Signed)
   Follow-Up Visit   Subjective  ERRIKA Lamb is a 54 y.o. female who presents for the following: Annual Exam (Patient has lots of moles and there are some that irritate patient and she wants to possibly remove them but she wants to wait till after the summer. ).   The following portions of the chart were reviewed this encounter and updated as appropriate: Tobacco  Allergies  Meds  Problems  Med Hx  Surg Hx  Fam Hx      Objective  Well appearing patient in no apparent distress; mood and affect are within normal limits.  A full examination was performed including scalp, head, eyes, ears, nose, lips, neck, chest, axillae, abdomen, back, buttocks, bilateral upper extremities, bilateral lower extremities, hands, feet, fingers, toes, fingernails, and toenails. All findings within normal limits unless otherwise noted below.  Objective  head to toe: No DN  Objective  Left Upper Back, Mid Back, Right Temporal Scalp, Right Upper Back: Stuck-on, waxy papules and plaques.    Assessment & Plan  Screening exam for skin cancer head to toe  observe  Seborrheic keratosis (4) Left Upper Back; Right Upper Back; Mid Back; Right Temporal Scalp  observe

## 2020-11-14 ENCOUNTER — Encounter: Payer: Self-pay | Admitting: Physician Assistant

## 2020-11-14 ENCOUNTER — Ambulatory Visit (INDEPENDENT_AMBULATORY_CARE_PROVIDER_SITE_OTHER): Payer: 59 | Admitting: Physician Assistant

## 2020-11-14 ENCOUNTER — Other Ambulatory Visit: Payer: Self-pay

## 2020-11-14 DIAGNOSIS — D485 Neoplasm of uncertain behavior of skin: Secondary | ICD-10-CM

## 2020-11-14 NOTE — Patient Instructions (Signed)

## 2020-11-14 NOTE — Progress Notes (Signed)
   Follow-Up Visit   Subjective  Andrea Lamb is a 55 y.o. female who presents for the following: Skin Problem (Patient here today for removal of Seborrheic keratosis on back. Per patient the Seborrheic keratosis are dry and itchy and in the area of her bra.  Also recheck Seborrheic keatosis on her right scalp.).   The following portions of the chart were reviewed this encounter and updated as appropriate:      Objective  Well appearing patient in no apparent distress; mood and affect are within normal limits.  All skin waist up examined.  Objective  Left Upper Back: Manson Passey raised lesion     Objective  Mid Back: Manson Passey raised lesion     Objective  Right Upper Back: Brown raised lesion      Assessment & Plan  Neoplasm of uncertain behavior of skin (3) Left Upper Back  Skin / nail biopsy Type of biopsy: tangential   Informed consent: discussed and consent obtained   Timeout: patient name, date of birth, surgical site, and procedure verified   Procedure prep:  Patient was prepped and draped in usual sterile fashion (Non sterile) Prep type:  Chlorhexidine Anesthesia: the lesion was anesthetized in a standard fashion   Anesthetic:  1% lidocaine w/ epinephrine 1-100,000 local infiltration Instrument used: flexible razor blade   Outcome: patient tolerated procedure well   Post-procedure details: wound care instructions given    Specimen 1 - Surgical pathology Differential Diagnosis: r/o sk  Check Margins: No  Mid Back  Skin / nail biopsy Type of biopsy: tangential   Informed consent: discussed and consent obtained   Timeout: patient name, date of birth, surgical site, and procedure verified   Procedure prep:  Patient was prepped and draped in usual sterile fashion (Non sterile) Prep type:  Chlorhexidine Anesthesia: the lesion was anesthetized in a standard fashion   Anesthetic:  1% lidocaine w/ epinephrine 1-100,000 local infiltration Instrument used:  flexible razor blade   Outcome: patient tolerated procedure well   Post-procedure details: wound care instructions given    Specimen 2 - Surgical pathology Differential Diagnosis: r/o sk  Check Margins: No  Right Upper Back  Skin / nail biopsy Type of biopsy: tangential   Informed consent: discussed and consent obtained   Timeout: patient name, date of birth, surgical site, and procedure verified   Procedure prep:  Patient was prepped and draped in usual sterile fashion (Non sterile) Prep type:  Chlorhexidine Anesthesia: the lesion was anesthetized in a standard fashion   Anesthetic:  1% lidocaine w/ epinephrine 1-100,000 local infiltration Instrument used: flexible razor blade   Outcome: patient tolerated procedure well   Post-procedure details: wound care instructions given    Specimen 3 - Surgical pathology Differential Diagnosis: r/o sk  Check Margins: No    I, Karmela Bram, PA-C, have reviewed all documentation's for this visit.  The documentation on 11/14/20 for the exam, diagnosis, procedures and orders are all accurate and complete.

## 2022-08-29 ENCOUNTER — Ambulatory Visit: Payer: 59 | Admitting: Physician Assistant
